# Patient Record
Sex: Male | Born: 1980 | Race: White | Marital: Married
Health system: Northeastern US, Academic
[De-identification: ages and names within clinical notes are randomized; demographics above are authoritative.]

## PROBLEM LIST (undated history)

## (undated) DIAGNOSIS — J45909 Unspecified asthma, uncomplicated: Secondary | ICD-10-CM

## (undated) HISTORY — PX: HERNIA REPAIR: SHX51

## (undated) HISTORY — PX: KNEE ARTHROPLASTY: SHX992

## (undated) HISTORY — PX: VASECTOMY: SHX75

## (undated) NOTE — Progress Notes (Signed)
Formatting of this note is different from the original.  [facility] PHYSICAL [facility]ERAP[facility] [place]  OUTPATIENT PHYSICAL THERAPY  05/18/2020  Note Type: Evaluation      Patient Name: Eric Wilson  Date of Birth:1981/03/14  Diagnosis:   Encounter Diagnosis   Name Primary?   ? Low back pain, unspecified back pain laterality, unspecified chronicity, unspecified whether sciatica present Yes     Referring MD:  Carlena Hurl, PAC     Date of Onset of Impairment-No date available  Date PT Care Plan Established or Reviewed-No date available  Date PT Treatment Started-No date available   Plan of Care Effective Date: 05/18/2020 - 07/16/2020  Visit 1/12 Reassessment due 06/15/20    Assessment/Plan:    Assessment details:       Impairments indicating medical necessity and functional limitations include: significant lower extremity weakness, limited ROM, pain, balance and gait impairments that are limiting her ability to complete basic ADL's or home/community ambulation without difficulty. Treatment classification: Lumbar Derangement. Patient will benefit from skilled PT intervention to address current body structure impairments and activity limitations and return to functions of daily walking, standing tolerance painfree.   Impairments: decreased range of motion, decreased strength, pain and core weakness    Body System: Musculoskeltal  Clinical Presentation: stable  Clinical Decision Making: moderate  Prognosis: good prognosis  Positive Prognosis Rationale: age and motivated for treatment.    Therapy Goals  Goals:      Short-term Goals to be achieved in 3 weeks. Patient will:  1. Be independent with home program for self-management of symptoms.  2. Improve L-spine/L hip pain by 50% for improved mobility and completion of ADL's.  3. Improve L-spine/L hip ROM 50% for improved mobility and completion of ADL's.  4. Improve Modified Oswestry score by 10pts.  5. Improve standing/walking tolerance > 45 min with no greater than 1/10  pain.    Long-term Goals to be achieved in 6 weeks. Patient will:  1. Have full pain free L-spine/ L Hip AROM in all planes.  2. Abolish all symptoms at onset independently 3/3 trials  3. Improve lumbar spine and core strength to 5/5 for improved general mobility   4. Complete community and recreational ambulation without onset of symptoms greater than 1/10 pain.  5. Improve Modified Oswestry score by 20pts.    Plan  Therapy options: will be seen for skilled physical therapy services  Planned therapy interventions: Manual Therapy, Therapeutic Activities, Therapeutic Exercises, Home Exercise Program, Education - Patient and Postural Training    Frequency: 2x week  Duration in weeks: 6  Education provided to: patient.  Education provided: HEP and Posture  Education results: needs reinforcement and verbalized good understanding.  Communication/Consultation: Discussed with PTA.    Total Session Time: 60  Treatment rendered today:       Therapeutic Exercises:  -Prone on elbows for mid range extension x 4 min.  -Prone press ups with sag, 10x  -Standing wall sags for lumbar extension x 10  -Standing lumbar extension with table as fulcrum, use of sheet as fulcrum, 10 x each  -Hooklying abdominal brace 15 x 5 sec holds  -Hooklying abd brace with alt le marching x 10  -Hooklying abd brace with alt hip bent knee fallouts x 10  -Hooklying B hip abd with black theraband with bridging and hold, 15 x 5 sec holds  Plan details:      Intervention to progress with pain reduction, stabilization, core strengthening, postural re-education, recovery of function  and prophylaxis.   Modalities (heat, ice, ultrasound, electrical stimulation, iontophoresis), as needed for pain management. Massage, mobilization and manual therapy as needed to increase ROM in restricted soft tissues and joints. Therapeutic exercise, Neuromuscular Re-education, and education.    Subjective:   History of Present Illness    Date of Evaluation:  05/18/2020    Reason for Referral/Chief Complaint:       Patient is a 23 y/o male who presents to PT with history of intermittant low back pain with intermittant B hip pain. Patient reports original injury occurred in 2008 with most severe re-injury in 2018. Patient reports frequent "flare ups" with increased central lower lumbar pain with stiffness that always take several weeks to return to normal.  Subjective:     Patient reports he recently attended chiropractic session and after manipulation pain was abolished for a brief time.  Quality of life: good    Pain  Current pain rating: 6  At best pain rating: 2  At worst pain rating: 10  Location: Beltline lumbar spine central, left posterior hip intermittant  Quality: aching, radiating and stiffness  Relieving factors: change in position  Aggravating factors: bending and reaching    Physical Limitation(s)-Patient limited with daily functions 25% per Oswestry Index  Current functional status: limited recreation and limited lifting    Precautions and Equipment  Precautions: None  Current Braces/Orthoses: None  Equipment Currently Used: None  Social Support  Hand dominance: right  Communication Preference: verbal, written and visual    Diagnostic Tests  X-ray: normal  MRI studies: normal    Treatments  Previous treatment: chiropractic treatment  Current treatment: chiropractic    Patient Goals  Patient goals for therapy: decreased pain    Objective:     Tenderness   Additional Tenderness Details-L4-S1 spinous processes moderately tender    Active Range of Motion  Lumbar  Flexion: 45 degrees   Extension: 20 degrees     Muscle Activation   Patient unable to activate left transverse abdominals, left multifidus, right transverse abdominals and right multifidus.     Tests   Lumbar   Left   Positive quadrant flexion.   Negative passive SLR and quadrant extension.   Right   Positive passive SLR and quadrant flexion.   Negative quadrant extension.                   I attest  that I have reviewed the above information.  Signed: Linna Darner, PT  05/18/2020 11:44 AM    Electronically signed by Linna Darner, PT at 05/18/2020 11:45 AM EST

## (undated) NOTE — Telephone Encounter (Signed)
Formatting of this note might be different from the original.  Called patient yesterday to remind him of appt-he said he would not be coming back as a patient.  Electronically signed by Jolayne Haines L at 02/29/2020  8:24 AM EST

---

## 2010-11-21 ENCOUNTER — Other Ambulatory Visit: Payer: Self-pay | Admitting: Primary Care

## 2015-06-22 NOTE — H&P (Deleted)
  NTS SOAP Note  Vital Signs:  Vitals as of: 06/21/2015: Systolic 137: Diastolic 82: Heart Rate 73: Temp 98.7F (Temporal): Height 6ft 3in: Weight 252Lbs 0 Ounces: Pain Level 1: BMI 31.5   BMI : 31.5 kg/m2  Subjective: This 35 year old male presents for of a left inguinal hernia.  Has been present  for some time now, but is increasing in size and causing him discomfort.  Made worse with straining.  Review of Symptoms:  Constitutional:unremarkable   Head:unremarkable Eyes:unremarkable   Nose/Mouth/Throat:unremarkable Cardiovascular:  unremarkable Respiratory:unremarkable Gastrointestinal:  unremarkable   Genitourinary:unremarkable   joint and back pain Skin:unremarkable Hematolgic/Lymphatic:unremarkable   Allergic/Immunologic:unremarkable   Past Medical History:  Reviewed  Past Medical History  Surgical History: cholecystectomy Medical Problems: none Allergies: nkda Medications: none   Social History:Reviewed  Social History  Preferred Language: English Race:  White Ethnicity: Not Hispanic / Latino Age: 68 Years 2 Months Marital Status:  S Alcohol: socially Recreational drug(s):  No   Smoking Status: Never smoker reviewed on 06/21/2015 Functional Status reviewed on 06/21/2015 ------------------------------------------------ Bathing: Normal Cooking: Normal Dressing: Normal Driving: Normal Eating: Normal Managing Meds: Normal Oral Care: Normal Shopping: Normal Toileting: Normal Transferring: Normal Walking: Normal Cognitive Status reviewed on 06/21/2015 ------------------------------------------------ Attention: Normal Decision Making: Normal Language: Normal Memory: Normal Motor: Normal Perception: Normal Problem Solving: Normal Visual and Spatial: Normal   Family History:Reviewed  Family Health History Mother, Deceased; Healthy;  Father, Deceased; Cancer unspecified - Throat;     Objective Information: General:Well  appearing, well nourished in no distress. Heart:RRR, no murmur or gallop.  Normal S1, S2.  No S3, S4.  Lungs:  CTA bilaterally, no wheezes, rhonchi, rales.  Breathing unlabored. Abdomen:Soft, NT/ND, no HSM, no masses.  Reducible left inguinal hernia, large.  Assessment:Left inguinal hernia  Diagnoses: 550.90  K40.90 Inguinal hernia (Unilateral inguinal hernia, without obstruction or gangrene, not specified as recurrent)  Procedures: 99214 - OFFICE OUTPATIENT VISIT 25 MINUTES    Plan:   Scheduled for left inguinal herniorrhaphy with mesh on 07/18/15.   Patient Education:Alternative treatments to surgery were discussed with patient (and family).  Risks and benefits  of procedure including bleeding, infection, mesh use, and the possibility of recurrence of the hernia were fully explained to the patient (and family) who gave informed consent. Patient/family questions were addressed.  Follow-up:Pending Surgery 

## 2015-07-16 ENCOUNTER — Encounter (HOSPITAL_COMMUNITY): Payer: Self-pay

## 2015-07-16 ENCOUNTER — Encounter (HOSPITAL_COMMUNITY)
Admission: RE | Admit: 2015-07-16 | Discharge: 2015-07-16 | Disposition: A | Discharge status: Home / Self Care | Payer: BLUE CROSS/BLUE SHIELD | Source: Ambulatory Visit | Attending: General Surgery | Admitting: General Surgery

## 2015-07-16 DIAGNOSIS — Z01812 Encounter for preprocedural laboratory examination: Secondary | ICD-10-CM | POA: Diagnosis present

## 2015-07-16 HISTORY — DX: Unspecified asthma, uncomplicated: J45.909

## 2015-07-16 LAB — CBC WITH DIFFERENTIAL/PLATELET
BASOS PCT: 0 %
Basophils Absolute: 0 10*3/uL (ref 0.0–0.1)
EOS ABS: 0.2 10*3/uL (ref 0.0–0.7)
EOS PCT: 3 %
HEMATOCRIT: 44 % (ref 39.0–52.0)
Hemoglobin: 15.2 g/dL (ref 13.0–17.0)
Lymphocytes Relative: 25 %
Lymphs Abs: 1.3 10*3/uL (ref 0.7–4.0)
MCH: 29.1 pg (ref 26.0–34.0)
MCHC: 34.5 g/dL (ref 30.0–36.0)
MCV: 84.1 fL (ref 78.0–100.0)
MONO ABS: 0.6 10*3/uL (ref 0.1–1.0)
MONOS PCT: 11 %
Neutro Abs: 3 10*3/uL (ref 1.7–7.7)
Neutrophils Relative %: 61 %
PLATELETS: 184 10*3/uL (ref 150–400)
RBC: 5.23 MIL/uL (ref 4.22–5.81)
RDW: 12.7 % (ref 11.5–15.5)
WBC: 5 10*3/uL (ref 4.0–10.5)

## 2015-07-16 LAB — BASIC METABOLIC PANEL
Anion gap: 8 (ref 5–15)
BUN: 12 mg/dL (ref 6–20)
CALCIUM: 9.3 mg/dL (ref 8.9–10.3)
CO2: 29 mmol/L (ref 22–32)
CREATININE: 0.86 mg/dL (ref 0.61–1.24)
Chloride: 105 mmol/L (ref 101–111)
GFR calc Af Amer: >60 mL/min (ref >60)
GLUCOSE: 96 mg/dL (ref 65–99)
Potassium: 4.8 mmol/L (ref 3.5–5.1)
Sodium: 142 mmol/L (ref 135–145)

## 2015-07-16 NOTE — H&P (Signed)
  NTS SOAP Note  Vital Signs:  Vitals as of: 07/16/2015: Systolic 114: Diastolic 82: Heart Rate 75: Temp 98.84F (Temporal): Height 655ft 6in: Weight 158Lbs 0 Ounces: BMI 25.5   BMI : 25.5 kg/m2  Subjective: This 35 year old male presents for of a growth on his left hip.  Has been present for some time, but is increasing is size and causing discomfort.    Review of Symptoms:  Constitutional:unremarkable   Head:unremarkable Eyes:unremarkable   sinus problems Cardiovascular:  unremarkable Respiratory:unremarkable Gastrointestinal:  unremarkable   Genitourinary:unremarkable   joint, neck, and back pain Hematolgic/Lymphatic:unremarkable   Allergic/Immunologic:unremarkable   Past Medical History:  Reviewed  Past Medical History  Surgical History: right knee surgery, hernia repair Medical Problems: none Allergies: darvocet, septra Medications: none   Social History:Reviewed  Social History  Preferred Language: English Race:  White Ethnicity: Not Hispanic / Latino Age: [age] year Marital Status:  S Alcohol: socially   Smoking Status: Never smoker reviewed on 06/07/2015 Functional Status reviewed on 06/07/2015 ------------------------------------------------ Bathing: Normal Cooking: Normal Dressing: Normal Driving: Normal Eating: Normal Managing Meds: Normal Oral Care: Normal Shopping: Normal Toileting: Normal Transferring: Normal Walking: Normal Cognitive Status reviewed on 06/07/2015 ------------------------------------------------ Attention: Normal Decision Making: Normal Language: Normal Memory: Normal Motor: Normal Perception: Normal Problem Solving: Normal Visual and Spatial: Normal   Family History:Reviewed  Family Health History Family History is Unknown    Objective Information: General:Well appearing, well nourished in no distress. 3cm exophytic soft tissue mass, left hip, tender to touch Heart:RRR, no murmur Lungs:  CTA  bilaterally, no wheezes, rhonchi, rales.  Breathing unlabored.  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   Patient Education:Alternative treatments to surgery were discussed with patient (and family).  Risks and benefits  of procedure were fully explained to the patient (and family) who gave informed consent. Patient/family questions were addressed.

## 2015-07-16 NOTE — Patient Instructions (Signed)
    Sean Rojas  07/16/2015     @PREFPERIOPPHARMACY @   Your procedure is scheduled on 07/20/2015.  [facility].  Call this number if you have problems the morning of surgery:  [phone]   Remember:  Do not eat food or drink liquids after midnight.  Take these medicines the morning of surgery with A SIP OF WATER NA   Do not wear jewelry, make-up or nail polish.  Do not wear lotions, powders, or perfumes.  You may wear deodorant.  Do not shave 48 hours prior to surgery.  Men may shave face and neck.  Do not bring valuables to the hospital.  [facility] belongings or valuables.  Contacts, dentures or bridgework may not be worn into surgery.  Leave your suitcase in the car.  After surgery it may be brought to your room.  For patients admitted to the hospital, discharge time will be determined by your treatment team.  Patients discharged the day of surgery will not be allowed to drive home.    Please read over the following fact sheets that you were given. Surgical Site Infection Prevention and Anesthesia Post-op Instructions     PATIENT INSTRUCTIONS POST-ANESTHESIA  IMMEDIATELY FOLLOWING SURGERY:  Do not drive or operate machinery for the first twenty four hours after surgery.  Do not make any important decisions for twenty four hours after surgery or while taking narcotic pain medications or sedatives.  If you develop intractable nausea and vomiting or a severe headache please notify your doctor immediately.  FOLLOW-UP:  Please make an appointment with your surgeon as instructed. You do not need to follow up with anesthesia unless specifically instructed to do so.  WOUND CARE INSTRUCTIONS (if applicable):  Keep a dry clean dressing on the anesthesia/puncture wound site if there is drainage.  Once the wound has quit draining you may leave it open to air.  Generally you should leave the bandage intact for twenty four hours unless  there is drainage.  If the epidural site drains for more than 36-48 hours please call the anesthesia department.  QUESTIONS?:  Please feel free to call your physician or the hospital operator if you have any questions, and they will be happy to assist you.

## 2015-07-20 ENCOUNTER — Encounter (HOSPITAL_COMMUNITY): Payer: Self-pay | Admitting: Anesthesiology

## 2015-07-20 ENCOUNTER — Ambulatory Visit (HOSPITAL_COMMUNITY)
Admission: RE | Admit: 2015-07-20 | Discharge: 2015-07-20 | Disposition: A | Discharge status: Home / Self Care | Payer: BLUE CROSS/BLUE SHIELD | Source: Ambulatory Visit | Attending: General Surgery | Admitting: General Surgery

## 2015-07-20 ENCOUNTER — Encounter (HOSPITAL_COMMUNITY)
Admission: RE | Disposition: A | Discharge status: Home / Self Care | Payer: Self-pay | Source: Ambulatory Visit | Attending: General Surgery

## 2015-07-20 ENCOUNTER — Ambulatory Visit (HOSPITAL_COMMUNITY): Payer: BLUE CROSS/BLUE SHIELD | Admitting: Anesthesiology

## 2015-07-20 DIAGNOSIS — R2232 Localized swelling, mass and lump, left upper limb: Secondary | ICD-10-CM | POA: Diagnosis not present

## 2015-07-20 DIAGNOSIS — Z87891 Personal history of nicotine dependence: Secondary | ICD-10-CM | POA: Insufficient documentation

## 2015-07-20 HISTORY — PX: MASS EXCISION: SHX2000

## 2015-07-20 SURGERY — EXCISION MASS
Anesthesia: General | Site: Hip | Laterality: Left

## 2015-07-20 MED ORDER — LIDOCAINE HCL 1 % IJ SOLN
INTRAMUSCULAR | Status: DC | PRN
  Administered 2015-07-20: 40 mg via INTRADERMAL

## 2015-07-20 MED ORDER — BUPIVACAINE HCL (PF) 0.5 % IJ SOLN
INTRAMUSCULAR | Status: AC
  Filled 2015-07-20: qty 30

## 2015-07-20 MED ORDER — BUPIVACAINE HCL (PF) 0.5 % IJ SOLN
INTRAMUSCULAR | Status: DC | PRN
  Administered 2015-07-20: 4.5 mL

## 2015-07-20 MED ORDER — ONDANSETRON HCL 4 MG/2ML IJ SOLN
4.0000 mg | Freq: Once | INTRAMUSCULAR | Status: AC
  Administered 2015-07-20: 4 mg via INTRAVENOUS
  Filled 2015-07-20: qty 2

## 2015-07-20 MED ORDER — FENTANYL CITRATE (PF) 100 MCG/2ML IJ SOLN
INTRAMUSCULAR | Status: DC | PRN
  Administered 2015-07-20: 50 ug via INTRAVENOUS

## 2015-07-20 MED ORDER — DEXAMETHASONE SODIUM PHOSPHATE 4 MG/ML IJ SOLN
4.0000 mg | Freq: Once | INTRAMUSCULAR | Status: AC
  Administered 2015-07-20: 4 mg via INTRAVENOUS
  Filled 2015-07-20: qty 1

## 2015-07-20 MED ORDER — MIDAZOLAM HCL 2 MG/2ML IJ SOLN
1.0000 mg | INTRAMUSCULAR | Status: DC | PRN
  Administered 2015-07-20: 2 mg via INTRAVENOUS
  Filled 2015-07-20: qty 2

## 2015-07-20 MED ORDER — PROPOFOL 10 MG/ML IV BOLUS
INTRAVENOUS | Status: DC | PRN
  Administered 2015-07-20: 150 mg via INTRAVENOUS

## 2015-07-20 MED ORDER — FENTANYL CITRATE (PF) 100 MCG/2ML IJ SOLN
INTRAMUSCULAR | Status: AC
  Filled 2015-07-20: qty 2

## 2015-07-20 MED ORDER — LIDOCAINE HCL (PF) 1 % IJ SOLN
INTRAMUSCULAR | Status: AC
  Filled 2015-07-20: qty 5

## 2015-07-20 MED ORDER — PROPOFOL 10 MG/ML IV BOLUS
INTRAVENOUS | Status: AC
  Filled 2015-07-20: qty 20

## 2015-07-20 MED ORDER — BACITRACIN-NEOMYCIN-POLYMYXIN 400-5-5000 EX OINT
TOPICAL_OINTMENT | CUTANEOUS | Status: AC
  Filled 2015-07-20: qty 1

## 2015-07-20 MED ORDER — KETOROLAC TROMETHAMINE 30 MG/ML IJ SOLN
INTRAMUSCULAR | Status: AC
  Filled 2015-07-20: qty 1

## 2015-07-20 MED ORDER — FENTANYL CITRATE (PF) 100 MCG/2ML IJ SOLN
25.0000 ug | INTRAMUSCULAR | Status: AC
Start: 2015-07-20 — End: 2015-07-20
  Administered 2015-07-20: 25 ug via INTRAVENOUS
  Filled 2015-07-20: qty 2

## 2015-07-20 MED ORDER — CHLORHEXIDINE GLUCONATE 4 % EX LIQD: 1.0000 "application " | Freq: Once | CUTANEOUS | Status: DC

## 2015-07-20 MED ORDER — POVIDONE-IODINE 10 % EX OINT
TOPICAL_OINTMENT | CUTANEOUS | Status: AC
  Filled 2015-07-20: qty 1

## 2015-07-20 MED ORDER — LACTATED RINGERS IV SOLN
INTRAVENOUS | Status: DC
  Administered 2015-07-20: 08:00:00 via INTRAVENOUS

## 2015-07-20 MED ORDER — CEFAZOLIN SODIUM-DEXTROSE 2-4 GM/100ML-% IV SOLN
2.0000 g | INTRAVENOUS | Status: AC
  Administered 2015-07-20: 2 g via INTRAVENOUS
  Filled 2015-07-20: qty 100

## 2015-07-20 MED ORDER — POVIDONE-IODINE 10 % OINT PACKET
TOPICAL_OINTMENT | CUTANEOUS | Status: DC | PRN
  Administered 2015-07-20: 1 via TOPICAL

## 2015-07-20 MED ORDER — FENTANYL CITRATE (PF) 100 MCG/2ML IJ SOLN: 25.0000 ug | INTRAMUSCULAR | Status: DC | PRN

## 2015-07-20 MED ORDER — ONDANSETRON HCL 4 MG/2ML IJ SOLN: 4.0000 mg | Freq: Once | INTRAMUSCULAR | Status: DC | PRN

## 2015-07-20 MED ORDER — 0.9 % SODIUM CHLORIDE (POUR BTL) OPTIME
TOPICAL | Status: DC | PRN
  Administered 2015-07-20: 1000 mL

## 2015-07-20 MED ORDER — HYDROCODONE-ACETAMINOPHEN 5-325 MG PO TABS: 1.0000 | ORAL_TABLET | ORAL | Status: AC | PRN

## 2015-07-20 MED ORDER — KETOROLAC TROMETHAMINE 30 MG/ML IJ SOLN
30.0000 mg | Freq: Once | INTRAMUSCULAR | Status: AC
  Administered 2015-07-20: 30 mg via INTRAVENOUS

## 2015-07-20 SURGICAL SUPPLY — 40 items
BAG HAMPER (MISCELLANEOUS) ×3 IMPLANT
BLADE SURG SZ11 CARB STEEL (BLADE) IMPLANT
CHLORAPREP W/TINT 10.5 ML (MISCELLANEOUS) ×3 IMPLANT
CLOTH BEACON ORANGE TIMEOUT ST (SAFETY) ×3 IMPLANT
COVER LIGHT HANDLE STERIS (MISCELLANEOUS) ×6 IMPLANT
DECANTER SPIKE VIAL GLASS SM (MISCELLANEOUS) ×3 IMPLANT
DERMABOND ADVANCED (GAUZE/BANDAGES/DRESSINGS)
DERMABOND ADVANCED .7 DNX12 (GAUZE/BANDAGES/DRESSINGS) IMPLANT
DRAPE EENT ADH APERT 31X51 STR (DRAPES) IMPLANT
ELECT NEEDLE TIP 2.8 STRL (NEEDLE) ×3 IMPLANT
ELECT REM PT RETURN 9FT ADLT (ELECTROSURGICAL) ×3
ELECTRODE REM PT RTRN 9FT ADLT (ELECTROSURGICAL) ×1 IMPLANT
FORMALIN 10 PREFIL 120ML (MISCELLANEOUS) ×3 IMPLANT
GLOVE BIO SURGEON STRL SZ7 (GLOVE) ×3 IMPLANT
GLOVE BIOGEL PI IND STRL 7.0 (GLOVE) ×2 IMPLANT
GLOVE BIOGEL PI INDICATOR 7.0 (GLOVE) ×4
GLOVE ECLIPSE 6.5 STRL STRAW (GLOVE) ×3 IMPLANT
GLOVE EXAM NITRILE MD LF STRL (GLOVE) ×3 IMPLANT
GLOVE SURG SS PI 7.5 STRL IVOR (GLOVE) ×3 IMPLANT
GOWN STRL REUS W/ TWL XL LVL3 (GOWN DISPOSABLE) ×1 IMPLANT
GOWN STRL REUS W/TWL LRG LVL3 (GOWN DISPOSABLE) ×6 IMPLANT
GOWN STRL REUS W/TWL XL LVL3 (GOWN DISPOSABLE) ×2
KIT ROOM TURNOVER APOR (KITS) ×3 IMPLANT
MANIFOLD NEPTUNE II (INSTRUMENTS) ×3 IMPLANT
NEEDLE 22X1 1/2 OR ONLY (MISCELLANEOUS)
NEEDLE 22X1.5 STRL (OR ONLY) (MISCELLANEOUS) IMPLANT
NEEDLE HYPO 25X1 1.5 SAFETY (NEEDLE) ×3 IMPLANT
NS IRRIG 1000ML POUR BTL (IV SOLUTION) ×3 IMPLANT
PACK MINOR (CUSTOM PROCEDURE TRAY) ×3 IMPLANT
PAD ARMBOARD 7.5X6 YLW CONV (MISCELLANEOUS) ×3 IMPLANT
SET BASIN LINEN APH (SET/KITS/TRAYS/PACK) ×3 IMPLANT
SPONGE GAUZE 2X2 8PLY STER LF (GAUZE/BANDAGES/DRESSINGS) ×1
SPONGE GAUZE 2X2 8PLY STRL LF (GAUZE/BANDAGES/DRESSINGS) ×2 IMPLANT
SUT ETHILON 3 0 FSL (SUTURE) IMPLANT
SUT PROLENE 4 0 PS 2 18 (SUTURE) ×9 IMPLANT
SUT VIC AB 3-0 SH 27 (SUTURE) ×2
SUT VIC AB 3-0 SH 27X BRD (SUTURE) ×1 IMPLANT
SUT VIC AB 4-0 PS2 27 (SUTURE) IMPLANT
SYR CONTROL 10ML LL (SYRINGE) ×3 IMPLANT
TAPE CLOTH SURG 4X10 WHT LF (GAUZE/BANDAGES/DRESSINGS) ×3 IMPLANT

## 2015-07-20 NOTE — Discharge Instructions (Signed)

## 2015-07-20 NOTE — Anesthesia Postprocedure Evaluation (Signed)
Anesthesia Post Note  Patient: Sean Rojas  Procedure(s) Performed: Procedure(s) (LRB): EXCISION SOFT TISSUE MASS 3 CM (Left)  Patient location during evaluation: PACU Anesthesia Type: General Level of consciousness: awake and alert Pain management: pain level controlled Vital Signs Assessment: post-procedure vital signs reviewed and stable Respiratory status: spontaneous breathing Cardiovascular status: blood pressure returned to baseline Postop Assessment: no signs of nausea or vomiting Anesthetic complications: no    Last Vitals:  Filed Vitals:   07/20/15 0930 07/20/15 0941  BP: 114/82 134/90  Pulse: 25 67  Temp:  36.3 C  Resp: 12 18    Last Pain:  Filed Vitals:   07/20/15 0943  PainSc: 3                  Nikole Swartzentruber

## 2015-07-20 NOTE — Anesthesia Preprocedure Evaluation (Signed)
Anesthesia Evaluation  Patient identified by MRN, date of birth, ID band Patient awake    Reviewed: Allergy & Precautions, NPO status , Patient's Chart, lab work & pertinent test results  Airway Mallampati: II  TM Distance: >3 FB     Dental  (+) Teeth Intact   Pulmonary asthma (mild) , former smoker,    breath sounds clear to auscultation       Cardiovascular negative cardio ROS   Rhythm:Regular Rate:Normal     Neuro/Psych    GI/Hepatic negative GI ROS,   Endo/Other    Renal/GU      Musculoskeletal   Abdominal   Peds  Hematology   Anesthesia Other Findings   Reproductive/Obstetrics                             Anesthesia Physical Anesthesia Plan  ASA: II  Anesthesia Plan: General   Post-op Pain Management:    Induction: Intravenous  Airway Management Planned: LMA  Additional Equipment:   Intra-op Plan:   Post-operative Plan: Extubation in OR  Informed Consent: I have reviewed the patients History and Physical, chart, labs and discussed the procedure including the risks, benefits and alternatives for the proposed anesthesia with the patient or authorized representative who has indicated his/her understanding and acceptance.     Plan Discussed with:   Anesthesia Plan Comments:         Anesthesia Quick Evaluation

## 2015-07-20 NOTE — Interval H&P Note (Signed)
History and Physical Interval Note:  07/20/2015 8:01 AM  Sean Rojas  has presented today for surgery, with the diagnosis of soft tissue mass left hip  The various methods of treatment have been discussed with the patient and family. After consideration of risks, benefits and other options for treatment, the patient has consented to  Procedure(s): EXCISION SOFT TISSUE MASS 3 CM (Left) as a surgical intervention .  The patient's history has been reviewed, patient examined, no change in status, stable for surgery.  I have reviewed the patient's chart and labs.  Questions were answered to the patient's satisfaction.     Franky MachoJENKINS,Ranvir Renovato A

## 2015-07-20 NOTE — Op Note (Signed)
Patient:  Gus Heightaron Gheen  DOB:  15-Jan-1981  MRN:  [id number]   Preop Diagnosis:  3 cm soft tissue mass, left thigh  Postop Diagnosis:  Same  Procedure:  Excision of 3 cm soft tissue mass, left thigh  Surgeon:  Franky MachoMark Jesalyn Finazzo, M.D.  Anes:  Gen.  Indications:  Patient is a 35 year old white male who presents with a soft tissue mass on the left thigh. The risks and benefits of the procedure were fully explained to the patient, who gave informed consent.  Procedure note:  The patient was placed the supine position. After general anesthesia was administered, the left thigh was prepped and draped using usual the sterile technique with DuraPrep. Surgical site confirmation was performed.  An elliptical incision was made around the base of the growth on the left thigh. The dissection was taken down to the subcutaneous tissue. Lipomatous tissue was found. This was sent to pathology for further examination. A bleeding was controlled using Bovie electrocautery. The subcutaneous layer was reapproximated using a 3-0 Vicryl interrupted suture. The skin was closed using 4-0 Prolene interrupted sutures. 0.5% Sensorcaine was instilled into the surrounding wound. Betadine ointment and a dry sterile dressing were applied.  All tape and needle counts were correct at the end of the procedure. Patient was awakened and transferred to PACU in stable condition.    Complications:  None  EBL:  Minimal  Specimen:  Soft tissue mass, left thigh

## 2015-07-20 NOTE — Anesthesia Procedure Notes (Addendum)
Procedure Name: LMA Insertion Date/Time: 07/20/2015 8:17 AM Performed by: Glynn OctaveANIEL, Jasean Ambrosia E Pre-anesthesia Checklist: Patient identified, Patient being monitored, Emergency Drugs available, Timeout performed and Suction available Patient Re-evaluated:Patient Re-evaluated prior to inductionOxygen Delivery Method: Circle System Utilized Preoxygenation: Pre-oxygenation with 100% oxygen Intubation Type: IV induction Ventilation: Mask ventilation without difficulty LMA: LMA inserted LMA Size: 4.0 Number of attempts: 1 Placement Confirmation: positive ETCO2 and breath sounds checked- equal and bilateral

## 2015-07-20 NOTE — Transfer of Care (Signed)
Immediate Anesthesia Transfer of Care Note  Patient: Sean Rojas [name]  Procedure(s) Performed: Procedure(s): EXCISION SOFT TISSUE MASS 3 CM (Left)  Patient Location: PACU  Anesthesia Type:General  Level of Consciousness: awake, alert  and oriented  Airway & Oxygen Therapy: Patient Spontanous Breathing and Patient connected to face mask oxygen  Post-op Assessment: Report given to RN  Post vital signs: Reviewed and stable  Last Vitals:  Filed Vitals:   07/20/15 0800 07/20/15 0805  BP: 110/64 111/75  Pulse:    Temp:    Resp: 12 17    Complications: No apparent anesthesia complications

## 2015-07-23 ENCOUNTER — Encounter (HOSPITAL_COMMUNITY): Payer: Self-pay | Admitting: General Surgery

## 2015-12-05 DIAGNOSIS — S134XXA Sprain of ligaments of cervical spine, initial encounter: Secondary | ICD-10-CM | POA: Diagnosis not present

## 2015-12-05 DIAGNOSIS — S338XXA Sprain of other parts of lumbar spine and pelvis, initial encounter: Secondary | ICD-10-CM | POA: Diagnosis not present

## 2015-12-05 DIAGNOSIS — M531 Cervicobrachial syndrome: Secondary | ICD-10-CM | POA: Diagnosis not present

## 2015-12-05 DIAGNOSIS — M546 Pain in thoracic spine: Secondary | ICD-10-CM | POA: Diagnosis not present

## 2015-12-14 DIAGNOSIS — S134XXA Sprain of ligaments of cervical spine, initial encounter: Secondary | ICD-10-CM | POA: Diagnosis not present

## 2015-12-14 DIAGNOSIS — M531 Cervicobrachial syndrome: Secondary | ICD-10-CM | POA: Diagnosis not present

## 2015-12-14 DIAGNOSIS — M546 Pain in thoracic spine: Secondary | ICD-10-CM | POA: Diagnosis not present

## 2015-12-14 DIAGNOSIS — S338XXA Sprain of other parts of lumbar spine and pelvis, initial encounter: Secondary | ICD-10-CM | POA: Diagnosis not present

## 2015-12-19 DIAGNOSIS — S134XXA Sprain of ligaments of cervical spine, initial encounter: Secondary | ICD-10-CM | POA: Diagnosis not present

## 2015-12-19 DIAGNOSIS — S338XXA Sprain of other parts of lumbar spine and pelvis, initial encounter: Secondary | ICD-10-CM | POA: Diagnosis not present

## 2015-12-19 DIAGNOSIS — M531 Cervicobrachial syndrome: Secondary | ICD-10-CM | POA: Diagnosis not present

## 2015-12-19 DIAGNOSIS — M546 Pain in thoracic spine: Secondary | ICD-10-CM | POA: Diagnosis not present

## 2016-01-01 DIAGNOSIS — S134XXA Sprain of ligaments of cervical spine, initial encounter: Secondary | ICD-10-CM | POA: Diagnosis not present

## 2016-01-01 DIAGNOSIS — M531 Cervicobrachial syndrome: Secondary | ICD-10-CM | POA: Diagnosis not present

## 2016-01-01 DIAGNOSIS — M546 Pain in thoracic spine: Secondary | ICD-10-CM | POA: Diagnosis not present

## 2016-01-01 DIAGNOSIS — S338XXA Sprain of other parts of lumbar spine and pelvis, initial encounter: Secondary | ICD-10-CM | POA: Diagnosis not present

## 2016-02-08 DIAGNOSIS — S338XXA Sprain of other parts of lumbar spine and pelvis, initial encounter: Secondary | ICD-10-CM | POA: Diagnosis not present

## 2016-02-08 DIAGNOSIS — S134XXA Sprain of ligaments of cervical spine, initial encounter: Secondary | ICD-10-CM | POA: Diagnosis not present

## 2016-02-08 DIAGNOSIS — M531 Cervicobrachial syndrome: Secondary | ICD-10-CM | POA: Diagnosis not present

## 2016-02-08 DIAGNOSIS — M546 Pain in thoracic spine: Secondary | ICD-10-CM | POA: Diagnosis not present

## 2016-07-21 DIAGNOSIS — M531 Cervicobrachial syndrome: Secondary | ICD-10-CM | POA: Diagnosis not present

## 2016-07-21 DIAGNOSIS — M546 Pain in thoracic spine: Secondary | ICD-10-CM | POA: Diagnosis not present

## 2016-07-21 DIAGNOSIS — S338XXA Sprain of other parts of lumbar spine and pelvis, initial encounter: Secondary | ICD-10-CM | POA: Diagnosis not present

## 2016-07-21 DIAGNOSIS — S134XXA Sprain of ligaments of cervical spine, initial encounter: Secondary | ICD-10-CM | POA: Diagnosis not present

## 2016-07-29 DIAGNOSIS — S134XXA Sprain of ligaments of cervical spine, initial encounter: Secondary | ICD-10-CM | POA: Diagnosis not present

## 2016-07-29 DIAGNOSIS — M546 Pain in thoracic spine: Secondary | ICD-10-CM | POA: Diagnosis not present

## 2016-07-29 DIAGNOSIS — S338XXA Sprain of other parts of lumbar spine and pelvis, initial encounter: Secondary | ICD-10-CM | POA: Diagnosis not present

## 2016-07-29 DIAGNOSIS — M531 Cervicobrachial syndrome: Secondary | ICD-10-CM | POA: Diagnosis not present

## 2016-08-05 DIAGNOSIS — S134XXA Sprain of ligaments of cervical spine, initial encounter: Secondary | ICD-10-CM | POA: Diagnosis not present

## 2016-08-05 DIAGNOSIS — M546 Pain in thoracic spine: Secondary | ICD-10-CM | POA: Diagnosis not present

## 2016-08-05 DIAGNOSIS — M531 Cervicobrachial syndrome: Secondary | ICD-10-CM | POA: Diagnosis not present

## 2016-08-05 DIAGNOSIS — S338XXA Sprain of other parts of lumbar spine and pelvis, initial encounter: Secondary | ICD-10-CM | POA: Diagnosis not present

## 2016-08-21 DIAGNOSIS — M546 Pain in thoracic spine: Secondary | ICD-10-CM | POA: Diagnosis not present

## 2016-08-21 DIAGNOSIS — S134XXA Sprain of ligaments of cervical spine, initial encounter: Secondary | ICD-10-CM | POA: Diagnosis not present

## 2016-08-21 DIAGNOSIS — M531 Cervicobrachial syndrome: Secondary | ICD-10-CM | POA: Diagnosis not present

## 2016-08-21 DIAGNOSIS — S338XXA Sprain of other parts of lumbar spine and pelvis, initial encounter: Secondary | ICD-10-CM | POA: Diagnosis not present

## 2016-09-22 DIAGNOSIS — M531 Cervicobrachial syndrome: Secondary | ICD-10-CM | POA: Diagnosis not present

## 2016-09-22 DIAGNOSIS — S338XXA Sprain of other parts of lumbar spine and pelvis, initial encounter: Secondary | ICD-10-CM | POA: Diagnosis not present

## 2016-09-22 DIAGNOSIS — S134XXA Sprain of ligaments of cervical spine, initial encounter: Secondary | ICD-10-CM | POA: Diagnosis not present

## 2016-09-22 DIAGNOSIS — M546 Pain in thoracic spine: Secondary | ICD-10-CM | POA: Diagnosis not present

## 2017-08-12 DIAGNOSIS — Z9852 Vasectomy status: Secondary | ICD-10-CM | POA: Diagnosis not present

## 2017-10-19 DIAGNOSIS — J4520 Mild intermittent asthma, uncomplicated: Secondary | ICD-10-CM | POA: Diagnosis not present

## 2017-10-19 DIAGNOSIS — F900 Attention-deficit hyperactivity disorder, predominantly inattentive type: Secondary | ICD-10-CM | POA: Diagnosis not present

## 2017-10-19 DIAGNOSIS — Z6823 Body mass index (BMI) 23.0-23.9, adult: Secondary | ICD-10-CM | POA: Diagnosis not present

## 2017-12-14 ENCOUNTER — Encounter: Payer: Self-pay | Admitting: Psychology

## 2017-12-17 DIAGNOSIS — M546 Pain in thoracic spine: Secondary | ICD-10-CM | POA: Diagnosis not present

## 2017-12-17 DIAGNOSIS — S335XXA Sprain of ligaments of lumbar spine, initial encounter: Secondary | ICD-10-CM | POA: Diagnosis not present

## 2017-12-17 DIAGNOSIS — S134XXA Sprain of ligaments of cervical spine, initial encounter: Secondary | ICD-10-CM | POA: Diagnosis not present

## 2017-12-21 DIAGNOSIS — M546 Pain in thoracic spine: Secondary | ICD-10-CM | POA: Diagnosis not present

## 2017-12-21 DIAGNOSIS — S335XXA Sprain of ligaments of lumbar spine, initial encounter: Secondary | ICD-10-CM | POA: Diagnosis not present

## 2017-12-21 DIAGNOSIS — S134XXA Sprain of ligaments of cervical spine, initial encounter: Secondary | ICD-10-CM | POA: Diagnosis not present

## 2017-12-23 DIAGNOSIS — S134XXA Sprain of ligaments of cervical spine, initial encounter: Secondary | ICD-10-CM | POA: Diagnosis not present

## 2017-12-23 DIAGNOSIS — M546 Pain in thoracic spine: Secondary | ICD-10-CM | POA: Diagnosis not present

## 2017-12-23 DIAGNOSIS — S335XXA Sprain of ligaments of lumbar spine, initial encounter: Secondary | ICD-10-CM | POA: Diagnosis not present

## 2018-01-01 DIAGNOSIS — S335XXA Sprain of ligaments of lumbar spine, initial encounter: Secondary | ICD-10-CM | POA: Diagnosis not present

## 2018-01-01 DIAGNOSIS — M546 Pain in thoracic spine: Secondary | ICD-10-CM | POA: Diagnosis not present

## 2018-01-01 DIAGNOSIS — S134XXA Sprain of ligaments of cervical spine, initial encounter: Secondary | ICD-10-CM | POA: Diagnosis not present

## 2018-01-08 DIAGNOSIS — S134XXA Sprain of ligaments of cervical spine, initial encounter: Secondary | ICD-10-CM | POA: Diagnosis not present

## 2018-01-08 DIAGNOSIS — M546 Pain in thoracic spine: Secondary | ICD-10-CM | POA: Diagnosis not present

## 2018-01-08 DIAGNOSIS — S335XXA Sprain of ligaments of lumbar spine, initial encounter: Secondary | ICD-10-CM | POA: Diagnosis not present

## 2018-01-12 DIAGNOSIS — R4586 Emotional lability: Secondary | ICD-10-CM | POA: Diagnosis not present

## 2018-01-12 DIAGNOSIS — F900 Attention-deficit hyperactivity disorder, predominantly inattentive type: Secondary | ICD-10-CM | POA: Diagnosis not present

## 2018-01-12 DIAGNOSIS — Z6825 Body mass index (BMI) 25.0-25.9, adult: Secondary | ICD-10-CM | POA: Diagnosis not present

## 2018-01-29 DIAGNOSIS — F331 Major depressive disorder, recurrent, moderate: Secondary | ICD-10-CM | POA: Diagnosis not present

## 2018-01-29 DIAGNOSIS — F411 Generalized anxiety disorder: Secondary | ICD-10-CM | POA: Diagnosis not present

## 2018-02-05 DIAGNOSIS — F411 Generalized anxiety disorder: Secondary | ICD-10-CM | POA: Diagnosis not present

## 2018-02-05 DIAGNOSIS — F331 Major depressive disorder, recurrent, moderate: Secondary | ICD-10-CM | POA: Diagnosis not present

## 2018-02-12 DIAGNOSIS — F331 Major depressive disorder, recurrent, moderate: Secondary | ICD-10-CM | POA: Diagnosis not present

## 2018-02-12 DIAGNOSIS — F411 Generalized anxiety disorder: Secondary | ICD-10-CM | POA: Diagnosis not present

## 2018-02-19 ENCOUNTER — Encounter

## 2018-02-19 ENCOUNTER — Encounter: Payer: BLUE CROSS/BLUE SHIELD | Attending: Psychology | Admitting: Psychology

## 2018-02-19 DIAGNOSIS — F0630 Mood disorder due to known physiological condition, unspecified: Secondary | ICD-10-CM | POA: Diagnosis not present

## 2018-02-19 DIAGNOSIS — F39 Unspecified mood [affective] disorder: Secondary | ICD-10-CM | POA: Insufficient documentation

## 2018-02-19 DIAGNOSIS — F908 Attention-deficit hyperactivity disorder, other type: Secondary | ICD-10-CM

## 2018-02-19 DIAGNOSIS — Z23 Encounter for immunization: Secondary | ICD-10-CM | POA: Diagnosis not present

## 2018-02-28 ENCOUNTER — Encounter: Payer: Self-pay | Admitting: Psychology

## 2018-02-28 NOTE — Progress Notes (Signed)
Neuropsychological Consultation   Patient:   Sean Rojas   DOB:   Sep 17, 1980  MR Number:  [id number]  Location:  [facility] FOR PA[facility] MEDICINE [facility] PHYSICAL MEDICINE AND REHABILITATION [address] [place], [address] [id number] [facility] [place] [address] Dept: [phone]           Date of Service:   02/19/2018  Start Time:   8 AM End Time:   9 AM  Provider/Observer:  Arley Phenix, Psy.D.       Clinical Neuropsychologist       Billing Code/Service: Neurobehavioral status exam  Chief Complaint:    Sean Rojas is a 37 year old male who was referred by Marveen Reeks at day [facility] for neuropsychological testing for issues related to attentional deficits possibly adult residual attention deficit disorder versus possible autistic spectrum disorder and/or other mood disorders including anxiety and depression.  Reason for Service:  Sean Rojas is a 37 year old male referred for neuropsychological evaluation.  The patient has had issues with attentional difficulties, depression, anxiety, hyperactivity, forgetfulness, anger outbursts, avoidance of being around others, expressive language fluency, and difficulties with memory.  The patient describes mood changes and getting lost while driving.  He describes slurring his speech at times and lack of empathy of others.  He has difficulty adjusting to changes and is constantly feeling tense and anxious.  The patient reports that he was never diagnosed before with formal issues.  The patient is always had issues with reading comprehension and reading issues in school.  He does report he was in a student in school but kept himself most of the time.  The patient reports that he has had ADHD most of his life.  Reports he is never been able to Sit Still in School.  The patient reports that he has times of depression and low interest as well as anger.  The patient reports that he will wake up short tempered and started to  feel overwhelmed and very easily agitated.  He reports that he gets anxious very quickly and has a short/harsh reaction to others.  He reports that his big difficulties with anxiety and agitation started in the third grade.  He reports that this tends to come and go and last for couple of days per weeks and then stopped it the patient reports that no family history is ever been diagnosed with bipolar disorder.  The patient reports that his son and daughter both are easy to anger and have been followed by psychiatry/psychology.  The patient reports that a number of medications have been tried through the years.  The patient reports that he was tried on an SSRI/SNRI but experienced significant sexual side effects and slowed him down from an energy standpoint.  The patient reports that he was tried on Seroquel but it "knocked him out."  The patient reports the trazodone just made him feel "cloudy."  He is currently taking Wellbutrin 1 mg twice daily but is not sure if it is helping much.  The patient reports that 300 mg of Wellbutrin tended to help the most but he began developing breathing issues.  The patient reports that he went to speech therapy as a child from kindergarten to the second grade.  The patient reports that he always had difficulty with sensory integration and reports that this started around kindergarten.  The patient reports that he could not stand certain sounds and that would trigger anxiety.  The patient reports that he does not like to be  touched or feel close tight to his skin.  The patient does describe significant sleep disturbance.  The major effects all of his hat is putting significant tension in his marriage and leads to angry outburst.  Behavioral Observation: Sean Rojas  presents as a 36 y.o.-year-old Right Caucasian Male who appeared his stated age. his dress was Appropriate and he was Well Groomed and his manners were Appropriate to the situation.  his participation was  indicative of Appropriate and Attentive behaviors.  There were not any physical disabilities noted.  he displayed an appropriate level of cooperation and motivation.     Interactions:    Active Appropriate  Attention:   within normal limits and attention span and concentration were age appropriate  Memory:   within normal limits; recent and remote memory intact  Visuo-spatial:  not examined  Speech (Volume):  normal  Speech:   normal;   Thought Process:  Coherent and Relevant  Though Content:  WNL; not suicidal and not homicidal  Orientation:   person, place, time/date and situation  Judgment:   Good  Planning:   Good  Affect:    Anxious and Depressed  Mood:    Anxious  Insight:   Good  Intelligence:   normal  Marital Status/Living: The patient was born in [place] and had 3 siblings.  Patient reports that he was born after normal pregnancy and delivery and weighed 9 pounds 8 ounces.  He did have asthma as a child.  The patient reports that he had developmental issues with regard to reading, spelling, attentional issues and hyperactivity.  He has some motor impairments in his right hand related to hand strength.  He has some coordination issues with his hands and difficulties maintaining grip strength.  The patient is married and has 2 children.  He has 3 other children with his ex-wife.  The patient does describe marital issues and stress due to his many issues.  He feels like his marriage is failing due to his anger issues.  He was married in 2011.  He was married once before for 4 years.  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  The patient's hobbies and interests include art and music.  The patient served in the [employer] between 2004 and 2005.  He was honorably discharged but was  discharged early due to a pre-existing condition in his knee.  His highest rank and rank at discharge was Private First Class.  He worked in Hydrologist.   Substance Use:  No concerns of substance abuse are reported.    Education:   The patient has his masters degree and had a 3.25 grade point average in college.  He has degrees from the [employer] and [employer] as well as his associates degree from [employer].  Medical History:   Past Medical History:  Diagnosis Date  . Asthma    as a child            Abuse/Trauma History: The patient does not report any history of abuse or trauma.  Psychiatric History:  The patient describes long-standing issues with interpersonal interactions and sensitivity to tactile sensations or various sounds.  The patient has had attentional issues, social issues, difficulty with reading comprehension even though he has his masters degree, and problems with depression and anxiety and attentional issues.  Family Med/Psych History: History reviewed. No pertinent family history.  Risk of Suicide/Violence: low the patient denies any suicidal or homicidal ideation but he does have issues with anger and quick temper.  Impression/DX:  Sean Rojas is a 37 year old male referred for neuropsychological evaluation.  The patient has had issues with attentional difficulties, depression, anxiety, hyperactivity, forgetfulness, anger outbursts, avoidance of being around others, expressive language fluency, and difficulties with memory.  The patient describes mood changes and getting lost while driving.  He describes slurring his speech at times and lack of empathy of others.  He has difficulty adjusting to changes and is constantly feeling tense and anxious.  The patient reports that he was never diagnosed before with formal issues.  The patient is always had issues with reading comprehension and reading issues in school.  He does report he was in a  student in school but kept himself most of the time.  The patient reports that he has had ADHD most of his life.  Reports he is never been able to Sit Still in School.  The patient reports that he has times of depression and low interest as well as anger.  The patient reports that he will wake up short tempered and started to feel overwhelmed and very easily agitated.  He reports that he gets anxious very quickly and has a short/harsh reaction to others.  He reports that his big difficulties with anxiety and agitation started in the third grade.  He reports that this tends to come and go and last for couple of days per weeks and then stopped it the patient reports that no family history is ever been diagnosed with bipolar disorder.  The patient reports that his son and daughter both are easy to anger and have been followed by psychiatry/psychology.  The patient reports that a number of medications have been tried through the years.  The patient reports that he was tried on an SSRI/SNRI but experienced significant sexual side effects and slowed him down from an energy standpoint.  The patient reports that he was tried on Seroquel but it "knocked him out."  The patient reports the trazodone just made him feel "cloudy."  He is currently taking Wellbutrin 1 mg twice daily but is not sure if it is helping much.  The patient reports that 300 mg of Wellbutrin tended to help the most but he began developing breathing issues.  The patient reports that he went to speech therapy as a child from kindergarten to the second grade.  The patient reports that he always had difficulty with sensory integration and reports that this started around kindergarten.  The patient reports that he could not stand certain sounds and that would trigger anxiety.  The patient reports that he does not like to be touched or feel close tight to his skin.  The patient does describe significant sleep disturbance.  The major effects all of his hat  is putting significant tension in his marriage and leads to angry outburst.   Disposition/Plan:  We have set the patient up for formal neuropsychological testing.  Initially we will administer the comprehensive attention battery as well as the CAB CPT measures to get an in-depth analysis and assessment of his attention consonant functioning.  At that point we may do further testing but we will be working on issues related to diagnostic question of adult residual attention deficit disorder versus mood disorder versus mild autistic spectrum disorder.  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

## 2018-03-11 ENCOUNTER — Ambulatory Visit: Payer: BLUE CROSS/BLUE SHIELD | Admitting: Psychology

## 2018-03-12 DIAGNOSIS — F411 Generalized anxiety disorder: Secondary | ICD-10-CM | POA: Diagnosis not present

## 2018-03-12 DIAGNOSIS — F331 Major depressive disorder, recurrent, moderate: Secondary | ICD-10-CM | POA: Diagnosis not present

## 2018-03-15 ENCOUNTER — Encounter: Payer: BLUE CROSS/BLUE SHIELD | Attending: Psychology | Admitting: Psychology

## 2018-03-15 ENCOUNTER — Encounter: Payer: Self-pay | Admitting: Psychology

## 2018-03-15 DIAGNOSIS — F908 Attention-deficit hyperactivity disorder, other type: Secondary | ICD-10-CM | POA: Insufficient documentation

## 2018-03-15 DIAGNOSIS — F39 Unspecified mood [affective] disorder: Secondary | ICD-10-CM | POA: Insufficient documentation

## 2018-03-18 DIAGNOSIS — S134XXA Sprain of ligaments of cervical spine, initial encounter: Secondary | ICD-10-CM | POA: Diagnosis not present

## 2018-03-18 DIAGNOSIS — S335XXA Sprain of ligaments of lumbar spine, initial encounter: Secondary | ICD-10-CM | POA: Diagnosis not present

## 2018-03-18 DIAGNOSIS — M546 Pain in thoracic spine: Secondary | ICD-10-CM | POA: Diagnosis not present

## 2018-03-19 ENCOUNTER — Encounter: Payer: Self-pay | Admitting: Psychology

## 2018-03-19 ENCOUNTER — Encounter (HOSPITAL_BASED_OUTPATIENT_CLINIC_OR_DEPARTMENT_OTHER): Payer: BLUE CROSS/BLUE SHIELD | Admitting: Psychology

## 2018-03-19 DIAGNOSIS — F908 Attention-deficit hyperactivity disorder, other type: Secondary | ICD-10-CM | POA: Diagnosis not present

## 2018-03-19 DIAGNOSIS — F411 Generalized anxiety disorder: Secondary | ICD-10-CM

## 2018-03-19 DIAGNOSIS — F39 Unspecified mood [affective] disorder: Secondary | ICD-10-CM | POA: Diagnosis not present

## 2018-03-19 DIAGNOSIS — F840 Autistic disorder: Secondary | ICD-10-CM

## 2018-03-19 DIAGNOSIS — F331 Major depressive disorder, recurrent, moderate: Secondary | ICD-10-CM | POA: Diagnosis not present

## 2018-03-19 NOTE — Progress Notes (Signed)
Patient:  Sean Rojas   DOB: 12/31/1980  MR Number: [id number]  Location: [facility] FOR PAIN AND REHABILITATIVE MEDICINE [facility] PHYSICAL MEDICINE AND REHABILITATION [address] [place], [address] [id number] [facility] [place] [address] Dept: [phone]  Start: 8 AM End: 9 AM  Provider/Observer:     Hershal Coria PsyD  Chief Complaint:      Chief Complaint  Patient presents with  . Anxiety  . Agitation  . Stress    Reason For Service:     Sean Rojas is a 37 year old male referred for neuropsychological evaluation.  The patient has had issues with attentional difficulties, depression, anxiety, hyperactivity, forgetfulness, anger outbursts, avoidance of being around others, expressive language fluency, and difficulties with memory.  The patient describes mood changes and getting lost while driving.  He describes slurring his speech at times and lack of empathy of others.  He has difficulty adjusting to changes and is constantly feeling tense and anxious.  The patient reports that he was never diagnosed before with formal issues.  The patient is always had issues with reading comprehension and reading issues in school.  He does report he was in a student in school but kept himself most of the time.  The patient reports that he has had ADHD most of his life.  Reports he is never been able to Sit Still in School.  The patient reports that he has times of depression and low interest as well as anger.  The patient reports that he will wake up short tempered and started to feel overwhelmed and very easily agitated.  He reports that he gets anxious very quickly and has a short/harsh reaction to others.  He reports that his big difficulties with anxiety and agitation started in the third grade.  He reports that this tends to come and go and last for couple of days per weeks and then stopped it the patient reports that no family history is ever been diagnosed with bipolar disorder.  The  patient reports that his son and daughter both are easy to anger and have been followed by psychiatry/psychology.  The patient reports that a number of medications have been tried through the years.  The patient reports that he was tried on an SSRI/SNRI but experienced significant sexual side effects and slowed him down from an energy standpoint.  The patient reports that he was tried on Seroquel but it "knocked him out."  The patient reports the trazodone just made him feel "cloudy."  He is currently taking Wellbutrin 1 mg twice daily but is not sure if it is helping much.  The patient reports that 300 mg of Wellbutrin tended to help the most but he began developing breathing issues.  The patient reports that he went to speech therapy as a child from kindergarten to the second grade.  The patient reports that he always had difficulty with sensory integration and reports that this started around kindergarten.  The patient reports that he could not stand certain sounds and that would trigger anxiety.  The patient reports that he does not like to be touched or feel close tight to his skin.  The patient does describe significant sleep disturbance.  The major effects all of his hat is putting significant tension in his marriage and leads to angry outburst.  Testing Administered:  The patient was administered the comprehensive attention battery and the CAB CPT measure.  The patient appeared to fully participate and tried through out the measure.  There was an issue with 1 of the measures related to administration but it appears that it only affected the last couple of trials and not the initial aspect of the measure this measure was the Stroop.  Participation Level:   Active  Participation Quality:  Appropriate and Attentive      Behavioral Observation:  Well Groomed, Alert, and Appropriate.   Test Results:   Initially, the patient was administered the auditory/visual reaction time test.  The patient's  accuracy scores were excellent as he correctly identified 50 of 50 targets for both visual and the auditory versions of this measure.  The patient is average response times were also equal to or better than normative comparisons.  The patient was then administered the discriminate reaction time measure.  This has 3 subtest for this measure including a visual measure, and auditory measure, and a measure that require shifting between visual and auditory targets.  On the visual discriminate reaction time measure the patient correctly identified 35 of 35 targets with 0 errors of omission and 0 errors of commission.  This is well within normative expectations.  The patient's response time was 429 ms which is also equal to or better than normative expectations.  On the auditory discriminate reaction time test the patient correctly identified 35 of 35 targets with 0 errors of omission and 0 errors of commission.  Average response times for the auditory measure were equal to or better than normative expectations.  The patient's performance on the mixed discriminate reaction time, which requires cognitive shifting of attention between either auditory or visual targets, was quite excellent.  The patient correctly identified 30 of 30 targets with 0 errors of commission and 0 errors of omission.  His average response time was well within normative expectations.  The patient was then administered the auditory/visual scan reaction time test.  As previous measures showed the patient did quite well.  The patient got 100% accuracy on the visual task, 100% accuracy on the auditory task, and 100% accuracy on the mix task which requires shifting of attention.  The patient's response time in all 3 measures was also excellent and equal to or better than normative expectations.  The patient was then administered the auditory/visual encoding test.  The patient's performance on both auditory and visual encoding were equal to or better  than normative expectations.  In fact on the auditory backwards the patient performed nearly 2 standard deviations better than normative comparisons group.  There was no indication of any encoding deficits.  The patient was then administered the auditory/visual multi processing test.  This is a very demanding multi processing task requiring some of the most demanding elements of attention and concentration and this entire battery.  The patient's performance ranged between 1 standard deviation in 1-1/2 standard deviations better than his normative comparison groups.  This was true for all 6 measures.  The patient showed no deficits at all with regard to multi processing abilities on this very demanding task.  The patient was then administered the Stroop interference cancellation test.  There was an error in the administration of this measure at the very end that was administrative error.  However, the patient's performance was quite good on the first for non-interference test and showed no deterioration in performance when the interference was applied.  The patient showed good shifting abilities and abilities to refrain from external distractors.  The patient was then administered the visual vigilance measure from the CAP CPT test.  This is  a 15-minute task that is broken down into 3-minute blocks of time for analysis.  On the first 3 minutes of this measure the patient had 100% accuracy target item responses.  The patient had 0 errors of omission and 0 errors of commission.  His average response time was 443 ms which is well within normative expectations.  The patient's performance remained generally consistent although by the 9 to 12-minute mark of time his average response time it slowed to some degree.  It was still within normative limits as far as the slowing.  As far as his accuracy across this visit to a minute task for correct responses his performance never deteriorate and he had 100% accuracy for  correct responses and had 0 errors of omission and 0 errors of commission throughout the entire 15-minute task.  This pattern of performance is not consistent with any indications of the deterioration of attention is a function of time.  Summary of Results:   The results of the current neuropsychological evaluation looking at a broad range of objective measures of attention and concentration and executive functioning were all equal to or better than normative expectations for his age and education matched peers group.  There were no indications of any areas of attentional deficits from an objective standpoint.  The patient showed excellent focus execute abilities, shift attention and focus, excellent sustained attention and concentration, and excellent freedom from external distractions.  This was true for both auditory and visual modalities.  Impression/Diagnosis:   The results of this objective neuropsychological evaluation including clinical interview and review of available medical records as well as objective assessment of a broad range of attention and concentration measures in both auditory and visual sensory modalities are not consistent with a diagnosis of adult residual attention deficit disorder.  The patient, in fact, was quite good and exceeded most of his normative comparison group on most of the measures administered.  However, looking at information derived from review of available medical records as well as a 1 hour face-to-face clinical interview, the patient does appear to clearly meet diagnostic considerations for a diagnosis of mild autistic spectrum disorder as well as what is often comorbid with this diagnosis of anxiety type symptoms including generalized anxiety disorder and obsessive-compulsive type traits.  The patient does tend to be easily agitated and overstimulated through both interactions with others as well as having a history of tactile overstimulation.  However, the patient  has progressed quite well through life and this would only fall in the mild range of autistic spectrum disorder.  I will be providing feedback regarding the results of the current neuropsychological evaluation with the patient.  As far as recommendations, I do not think that the patient should be tried on any type of psychostimulant medication as it is likely to exacerbate the actual symptoms that we would be trying to treat.  While the patient initially had difficulty with a previous trial of SSRI medication it may be worth trying other SSRIs to see if the side effects that he was having from those medications would not happen with other variance of SSRI.  My suspicion that the difficulty he had with higher doses of Wellbutrin related to breathing problems may have actually been an exacerbation of some of the anxiety and agitation from the stimulant type of affect of the Wellbutrin.  I will speak further with the patient around other issues and recommendations.  Diagnosis:    Axis I: Autistic spectrum disorder  Generalized anxiety disorder  Sean PhenixJohn Rojas, Psy.D. Neuropsychologist

## 2018-03-19 NOTE — Progress Notes (Signed)
Patient arrived on time to his 8:00am testing appointment, which lasted 2 hours. He was alert and oriented x4. Eye contact was poor. He appeared anxious and with some psychomotor agitation noted (e.g. constantly shaking right leg). He was cooperative and completed all assigned tasks. He became mildly frustrated on a task that required him to respond quickly to audio and visual targets presented on a computer screen, but demonstrated adequate distress tolerance overall. Rapport was slightly difficult to establish as patient appeared guarded. Attention was sustained throughout testing and he appeared to give good effort.

## 2018-03-26 DIAGNOSIS — F411 Generalized anxiety disorder: Secondary | ICD-10-CM | POA: Diagnosis not present

## 2018-03-26 DIAGNOSIS — F331 Major depressive disorder, recurrent, moderate: Secondary | ICD-10-CM | POA: Diagnosis not present

## 2018-04-02 DIAGNOSIS — F411 Generalized anxiety disorder: Secondary | ICD-10-CM | POA: Diagnosis not present

## 2018-04-02 DIAGNOSIS — F331 Major depressive disorder, recurrent, moderate: Secondary | ICD-10-CM | POA: Diagnosis not present

## 2018-04-07 DIAGNOSIS — M779 Enthesopathy, unspecified: Secondary | ICD-10-CM | POA: Diagnosis not present

## 2018-04-07 DIAGNOSIS — Z6825 Body mass index (BMI) 25.0-25.9, adult: Secondary | ICD-10-CM | POA: Diagnosis not present

## 2018-04-10 DIAGNOSIS — F331 Major depressive disorder, recurrent, moderate: Secondary | ICD-10-CM | POA: Diagnosis not present

## 2018-04-10 DIAGNOSIS — F411 Generalized anxiety disorder: Secondary | ICD-10-CM | POA: Diagnosis not present

## 2018-04-12 DIAGNOSIS — R4586 Emotional lability: Secondary | ICD-10-CM | POA: Diagnosis not present

## 2018-04-12 DIAGNOSIS — M779 Enthesopathy, unspecified: Secondary | ICD-10-CM | POA: Diagnosis not present

## 2018-04-12 DIAGNOSIS — F900 Attention-deficit hyperactivity disorder, predominantly inattentive type: Secondary | ICD-10-CM | POA: Diagnosis not present

## 2018-04-12 DIAGNOSIS — Z6825 Body mass index (BMI) 25.0-25.9, adult: Secondary | ICD-10-CM | POA: Diagnosis not present

## 2018-04-19 ENCOUNTER — Encounter: Payer: Self-pay | Admitting: Psychology

## 2018-04-20 DIAGNOSIS — Z6825 Body mass index (BMI) 25.0-25.9, adult: Secondary | ICD-10-CM | POA: Diagnosis not present

## 2018-04-20 DIAGNOSIS — M779 Enthesopathy, unspecified: Secondary | ICD-10-CM | POA: Diagnosis not present

## 2018-04-20 DIAGNOSIS — G5602 Carpal tunnel syndrome, left upper limb: Secondary | ICD-10-CM | POA: Diagnosis not present

## 2018-04-23 ENCOUNTER — Encounter: Payer: BLUE CROSS/BLUE SHIELD | Admitting: Psychology

## 2018-04-23 DIAGNOSIS — F411 Generalized anxiety disorder: Secondary | ICD-10-CM | POA: Diagnosis not present

## 2018-04-23 DIAGNOSIS — F331 Major depressive disorder, recurrent, moderate: Secondary | ICD-10-CM | POA: Diagnosis not present

## 2018-04-27 ENCOUNTER — Encounter: Payer: BLUE CROSS/BLUE SHIELD | Attending: Psychology | Admitting: Psychology

## 2018-04-27 DIAGNOSIS — F411 Generalized anxiety disorder: Secondary | ICD-10-CM

## 2018-04-27 DIAGNOSIS — F908 Attention-deficit hyperactivity disorder, other type: Secondary | ICD-10-CM | POA: Diagnosis not present

## 2018-04-27 DIAGNOSIS — Z6825 Body mass index (BMI) 25.0-25.9, adult: Secondary | ICD-10-CM | POA: Diagnosis not present

## 2018-04-27 DIAGNOSIS — F429 Obsessive-compulsive disorder, unspecified: Secondary | ICD-10-CM | POA: Diagnosis not present

## 2018-04-27 DIAGNOSIS — M779 Enthesopathy, unspecified: Secondary | ICD-10-CM | POA: Diagnosis not present

## 2018-04-27 DIAGNOSIS — F39 Unspecified mood [affective] disorder: Secondary | ICD-10-CM | POA: Insufficient documentation

## 2018-04-27 DIAGNOSIS — F840 Autistic disorder: Secondary | ICD-10-CM | POA: Diagnosis not present

## 2018-04-28 ENCOUNTER — Encounter: Payer: Self-pay | Admitting: Psychology

## 2018-04-28 NOTE — Progress Notes (Signed)
Today I provided feedback regarding the results of the recent neuropsychological evaluation.  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  Below is the summary and results sections from the recent evaluation.  Summary of Results:                        The results of the current neuropsychological evaluation looking at a broad range of objective measures of attention and concentration and executive functioning were all equal to or better than normative expectations for his age and education matched peers group.  There were no indications of any areas of attentional deficits from an objective standpoint.  The patient showed excellent focus execute abilities, shift attention and focus, excellent sustained attention and concentration, and excellent freedom from external distractions.  This was true for both auditory and visual modalities.  Impression/Diagnosis:                     The results of this objective neuropsychological evaluation including clinical interview and review of available medical records as well as objective assessment of a broad range of attention and concentration measures in both auditory and visual sensory modalities are not consistent with a diagnosis of adult residual attention deficit disorder.  The patient, in fact, was quite good and exceeded most of his normative comparison group on most of the measures administered.  However, looking at information derived from review of available medical records as well as a 1 hour face-to-face clinical interview, the patient does appear to clearly meet diagnostic considerations for a diagnosis of mild autistic spectrum disorder as well as what is often comorbid with this diagnosis of anxiety type symptoms including generalized anxiety disorder and  obsessive-compulsive type traits.  The patient does tend to be easily agitated and overstimulated through both interactions with others as well as having a history of tactile overstimulation.  However, the patient has progressed quite well through life and this would only fall in the mild range of autistic spectrum disorder.  I will be providing feedback regarding the results of the current neuropsychological evaluation with the patient.  As far as recommendations, I do not think that the patient should be tried on any type of psychostimulant medication as it is likely to exacerbate the actual symptoms that we would be trying to treat.  While the patient initially had difficulty with a previous trial of SSRI medication it may be worth trying other SSRIs to see if the side effects that he was having from those medications would not happen with other variance of SSRI.  My suspicion that the difficulty he had with higher doses of Wellbutrin related to breathing problems may have actually been an exacerbation of some of the anxiety and agitation from the stimulant type of affect of the Wellbutrin.  I will speak further with the patient around other issues and recommendations.  Diagnosis:                               Axis I: Autistic spectrum disorder  Generalized anxiety disorder   Arley Phenix, Psy.D. Neuropsychologist

## 2018-04-30 DIAGNOSIS — F331 Major depressive disorder, recurrent, moderate: Secondary | ICD-10-CM | POA: Diagnosis not present

## 2018-04-30 DIAGNOSIS — F411 Generalized anxiety disorder: Secondary | ICD-10-CM | POA: Diagnosis not present

## 2018-05-06 ENCOUNTER — Ambulatory Visit: Payer: BLUE CROSS/BLUE SHIELD | Admitting: Psychology

## 2018-05-11 ENCOUNTER — Ambulatory Visit: Payer: BLUE CROSS/BLUE SHIELD | Admitting: Psychology

## 2018-05-11 ENCOUNTER — Encounter

## 2018-05-11 DIAGNOSIS — M5412 Radiculopathy, cervical region: Secondary | ICD-10-CM | POA: Diagnosis not present

## 2018-05-11 DIAGNOSIS — M79642 Pain in left hand: Secondary | ICD-10-CM | POA: Diagnosis not present

## 2018-05-14 DIAGNOSIS — F411 Generalized anxiety disorder: Secondary | ICD-10-CM | POA: Diagnosis not present

## 2018-05-14 DIAGNOSIS — F331 Major depressive disorder, recurrent, moderate: Secondary | ICD-10-CM | POA: Diagnosis not present

## 2018-05-20 ENCOUNTER — Ambulatory Visit: Payer: BLUE CROSS/BLUE SHIELD | Admitting: Psychology

## 2018-05-20 ENCOUNTER — Encounter

## 2018-05-20 DIAGNOSIS — S335XXA Sprain of ligaments of lumbar spine, initial encounter: Secondary | ICD-10-CM | POA: Diagnosis not present

## 2018-05-20 DIAGNOSIS — S134XXA Sprain of ligaments of cervical spine, initial encounter: Secondary | ICD-10-CM | POA: Diagnosis not present

## 2018-05-20 DIAGNOSIS — M546 Pain in thoracic spine: Secondary | ICD-10-CM | POA: Diagnosis not present

## 2018-05-21 DIAGNOSIS — F411 Generalized anxiety disorder: Secondary | ICD-10-CM | POA: Diagnosis not present

## 2018-05-21 DIAGNOSIS — F331 Major depressive disorder, recurrent, moderate: Secondary | ICD-10-CM | POA: Diagnosis not present

## 2018-05-24 ENCOUNTER — Encounter

## 2018-05-24 ENCOUNTER — Ambulatory Visit: Payer: BLUE CROSS/BLUE SHIELD | Admitting: Psychology

## 2018-05-25 DIAGNOSIS — S335XXA Sprain of ligaments of lumbar spine, initial encounter: Secondary | ICD-10-CM | POA: Diagnosis not present

## 2018-05-25 DIAGNOSIS — M546 Pain in thoracic spine: Secondary | ICD-10-CM | POA: Diagnosis not present

## 2018-05-25 DIAGNOSIS — S134XXA Sprain of ligaments of cervical spine, initial encounter: Secondary | ICD-10-CM | POA: Diagnosis not present

## 2018-05-31 DIAGNOSIS — F331 Major depressive disorder, recurrent, moderate: Secondary | ICD-10-CM | POA: Diagnosis not present

## 2018-05-31 DIAGNOSIS — F411 Generalized anxiety disorder: Secondary | ICD-10-CM | POA: Diagnosis not present

## 2018-05-31 DIAGNOSIS — G5602 Carpal tunnel syndrome, left upper limb: Secondary | ICD-10-CM | POA: Diagnosis not present

## 2018-06-02 DIAGNOSIS — S335XXA Sprain of ligaments of lumbar spine, initial encounter: Secondary | ICD-10-CM | POA: Diagnosis not present

## 2018-06-02 DIAGNOSIS — S134XXA Sprain of ligaments of cervical spine, initial encounter: Secondary | ICD-10-CM | POA: Diagnosis not present

## 2018-06-02 DIAGNOSIS — M546 Pain in thoracic spine: Secondary | ICD-10-CM | POA: Diagnosis not present

## 2018-06-07 ENCOUNTER — Other Ambulatory Visit: Payer: Self-pay | Admitting: Physician Assistant

## 2018-06-07 DIAGNOSIS — M542 Cervicalgia: Secondary | ICD-10-CM

## 2018-06-08 DIAGNOSIS — S335XXA Sprain of ligaments of lumbar spine, initial encounter: Secondary | ICD-10-CM | POA: Diagnosis not present

## 2018-06-08 DIAGNOSIS — M546 Pain in thoracic spine: Secondary | ICD-10-CM | POA: Diagnosis not present

## 2018-06-08 DIAGNOSIS — S134XXA Sprain of ligaments of cervical spine, initial encounter: Secondary | ICD-10-CM | POA: Diagnosis not present

## 2018-06-09 DIAGNOSIS — F411 Generalized anxiety disorder: Secondary | ICD-10-CM | POA: Diagnosis not present

## 2018-06-09 DIAGNOSIS — F331 Major depressive disorder, recurrent, moderate: Secondary | ICD-10-CM | POA: Diagnosis not present

## 2018-06-15 ENCOUNTER — Ambulatory Visit
Admission: RE | Admit: 2018-06-15 | Discharge: 2018-06-15 | Disposition: A | Discharge status: Home / Self Care | Payer: BLUE CROSS/BLUE SHIELD | Source: Ambulatory Visit | Attending: Physician Assistant | Admitting: Physician Assistant

## 2018-06-15 DIAGNOSIS — M542 Cervicalgia: Secondary | ICD-10-CM

## 2018-06-15 DIAGNOSIS — R202 Paresthesia of skin: Secondary | ICD-10-CM | POA: Diagnosis not present

## 2018-06-16 DIAGNOSIS — F331 Major depressive disorder, recurrent, moderate: Secondary | ICD-10-CM | POA: Diagnosis not present

## 2018-06-16 DIAGNOSIS — M546 Pain in thoracic spine: Secondary | ICD-10-CM | POA: Diagnosis not present

## 2018-06-16 DIAGNOSIS — S134XXA Sprain of ligaments of cervical spine, initial encounter: Secondary | ICD-10-CM | POA: Diagnosis not present

## 2018-06-16 DIAGNOSIS — S335XXA Sprain of ligaments of lumbar spine, initial encounter: Secondary | ICD-10-CM | POA: Diagnosis not present

## 2018-06-16 DIAGNOSIS — F411 Generalized anxiety disorder: Secondary | ICD-10-CM | POA: Diagnosis not present

## 2018-06-17 DIAGNOSIS — G5602 Carpal tunnel syndrome, left upper limb: Secondary | ICD-10-CM | POA: Diagnosis not present

## 2018-06-21 DIAGNOSIS — S134XXA Sprain of ligaments of cervical spine, initial encounter: Secondary | ICD-10-CM | POA: Diagnosis not present

## 2018-06-21 DIAGNOSIS — M546 Pain in thoracic spine: Secondary | ICD-10-CM | POA: Diagnosis not present

## 2018-06-21 DIAGNOSIS — S335XXA Sprain of ligaments of lumbar spine, initial encounter: Secondary | ICD-10-CM | POA: Diagnosis not present

## 2018-06-22 DIAGNOSIS — F411 Generalized anxiety disorder: Secondary | ICD-10-CM | POA: Diagnosis not present

## 2018-06-22 DIAGNOSIS — F331 Major depressive disorder, recurrent, moderate: Secondary | ICD-10-CM | POA: Diagnosis not present

## 2018-06-29 DIAGNOSIS — S134XXA Sprain of ligaments of cervical spine, initial encounter: Secondary | ICD-10-CM | POA: Diagnosis not present

## 2018-06-29 DIAGNOSIS — M546 Pain in thoracic spine: Secondary | ICD-10-CM | POA: Diagnosis not present

## 2018-06-29 DIAGNOSIS — S335XXA Sprain of ligaments of lumbar spine, initial encounter: Secondary | ICD-10-CM | POA: Diagnosis not present

## 2018-06-29 DIAGNOSIS — F331 Major depressive disorder, recurrent, moderate: Secondary | ICD-10-CM | POA: Diagnosis not present

## 2018-06-29 DIAGNOSIS — F411 Generalized anxiety disorder: Secondary | ICD-10-CM | POA: Diagnosis not present

## 2018-07-05 DIAGNOSIS — M546 Pain in thoracic spine: Secondary | ICD-10-CM | POA: Diagnosis not present

## 2018-07-05 DIAGNOSIS — S134XXA Sprain of ligaments of cervical spine, initial encounter: Secondary | ICD-10-CM | POA: Diagnosis not present

## 2018-07-05 DIAGNOSIS — S335XXA Sprain of ligaments of lumbar spine, initial encounter: Secondary | ICD-10-CM | POA: Diagnosis not present

## 2018-07-07 DIAGNOSIS — F411 Generalized anxiety disorder: Secondary | ICD-10-CM | POA: Diagnosis not present

## 2018-07-07 DIAGNOSIS — F331 Major depressive disorder, recurrent, moderate: Secondary | ICD-10-CM | POA: Diagnosis not present

## 2018-07-12 DIAGNOSIS — M546 Pain in thoracic spine: Secondary | ICD-10-CM | POA: Diagnosis not present

## 2018-07-12 DIAGNOSIS — S335XXA Sprain of ligaments of lumbar spine, initial encounter: Secondary | ICD-10-CM | POA: Diagnosis not present

## 2018-07-12 DIAGNOSIS — S134XXA Sprain of ligaments of cervical spine, initial encounter: Secondary | ICD-10-CM | POA: Diagnosis not present

## 2018-07-16 DIAGNOSIS — F411 Generalized anxiety disorder: Secondary | ICD-10-CM | POA: Diagnosis not present

## 2018-07-16 DIAGNOSIS — F331 Major depressive disorder, recurrent, moderate: Secondary | ICD-10-CM | POA: Diagnosis not present

## 2018-07-19 DIAGNOSIS — S335XXA Sprain of ligaments of lumbar spine, initial encounter: Secondary | ICD-10-CM | POA: Diagnosis not present

## 2018-07-19 DIAGNOSIS — M546 Pain in thoracic spine: Secondary | ICD-10-CM | POA: Diagnosis not present

## 2018-07-19 DIAGNOSIS — S134XXA Sprain of ligaments of cervical spine, initial encounter: Secondary | ICD-10-CM | POA: Diagnosis not present

## 2018-07-23 DIAGNOSIS — F331 Major depressive disorder, recurrent, moderate: Secondary | ICD-10-CM | POA: Diagnosis not present

## 2018-07-23 DIAGNOSIS — F411 Generalized anxiety disorder: Secondary | ICD-10-CM | POA: Diagnosis not present

## 2018-07-26 DIAGNOSIS — Z6825 Body mass index (BMI) 25.0-25.9, adult: Secondary | ICD-10-CM | POA: Diagnosis not present

## 2018-07-26 DIAGNOSIS — M2550 Pain in unspecified joint: Secondary | ICD-10-CM | POA: Diagnosis not present

## 2018-07-26 DIAGNOSIS — F900 Attention-deficit hyperactivity disorder, predominantly inattentive type: Secondary | ICD-10-CM | POA: Diagnosis not present

## 2018-07-26 DIAGNOSIS — S134XXA Sprain of ligaments of cervical spine, initial encounter: Secondary | ICD-10-CM | POA: Diagnosis not present

## 2018-07-26 DIAGNOSIS — F411 Generalized anxiety disorder: Secondary | ICD-10-CM | POA: Diagnosis not present

## 2018-07-26 DIAGNOSIS — S335XXA Sprain of ligaments of lumbar spine, initial encounter: Secondary | ICD-10-CM | POA: Diagnosis not present

## 2018-07-26 DIAGNOSIS — M546 Pain in thoracic spine: Secondary | ICD-10-CM | POA: Diagnosis not present

## 2018-07-28 DIAGNOSIS — M779 Enthesopathy, unspecified: Secondary | ICD-10-CM | POA: Diagnosis not present

## 2018-07-28 DIAGNOSIS — F900 Attention-deficit hyperactivity disorder, predominantly inattentive type: Secondary | ICD-10-CM | POA: Diagnosis not present

## 2018-07-28 DIAGNOSIS — M2550 Pain in unspecified joint: Secondary | ICD-10-CM | POA: Diagnosis not present

## 2018-07-28 DIAGNOSIS — E559 Vitamin D deficiency, unspecified: Secondary | ICD-10-CM | POA: Diagnosis not present

## 2018-07-28 DIAGNOSIS — R5383 Other fatigue: Secondary | ICD-10-CM | POA: Diagnosis not present

## 2018-07-28 DIAGNOSIS — R5382 Chronic fatigue, unspecified: Secondary | ICD-10-CM | POA: Diagnosis not present

## 2018-07-28 DIAGNOSIS — F429 Obsessive-compulsive disorder, unspecified: Secondary | ICD-10-CM | POA: Diagnosis not present

## 2018-07-28 DIAGNOSIS — F411 Generalized anxiety disorder: Secondary | ICD-10-CM | POA: Diagnosis not present

## 2018-07-30 DIAGNOSIS — F331 Major depressive disorder, recurrent, moderate: Secondary | ICD-10-CM | POA: Diagnosis not present

## 2018-07-30 DIAGNOSIS — F411 Generalized anxiety disorder: Secondary | ICD-10-CM | POA: Diagnosis not present

## 2018-08-02 DIAGNOSIS — S335XXA Sprain of ligaments of lumbar spine, initial encounter: Secondary | ICD-10-CM | POA: Diagnosis not present

## 2018-08-02 DIAGNOSIS — S134XXA Sprain of ligaments of cervical spine, initial encounter: Secondary | ICD-10-CM | POA: Diagnosis not present

## 2018-08-02 DIAGNOSIS — M546 Pain in thoracic spine: Secondary | ICD-10-CM | POA: Diagnosis not present

## 2018-08-06 DIAGNOSIS — F331 Major depressive disorder, recurrent, moderate: Secondary | ICD-10-CM | POA: Diagnosis not present

## 2018-08-06 DIAGNOSIS — F411 Generalized anxiety disorder: Secondary | ICD-10-CM | POA: Diagnosis not present

## 2018-08-09 DIAGNOSIS — S335XXA Sprain of ligaments of lumbar spine, initial encounter: Secondary | ICD-10-CM | POA: Diagnosis not present

## 2018-08-09 DIAGNOSIS — M546 Pain in thoracic spine: Secondary | ICD-10-CM | POA: Diagnosis not present

## 2018-08-09 DIAGNOSIS — S134XXA Sprain of ligaments of cervical spine, initial encounter: Secondary | ICD-10-CM | POA: Diagnosis not present

## 2018-08-16 DIAGNOSIS — S134XXA Sprain of ligaments of cervical spine, initial encounter: Secondary | ICD-10-CM | POA: Diagnosis not present

## 2018-08-16 DIAGNOSIS — S335XXA Sprain of ligaments of lumbar spine, initial encounter: Secondary | ICD-10-CM | POA: Diagnosis not present

## 2018-08-16 DIAGNOSIS — M546 Pain in thoracic spine: Secondary | ICD-10-CM | POA: Diagnosis not present

## 2018-08-20 DIAGNOSIS — F411 Generalized anxiety disorder: Secondary | ICD-10-CM | POA: Diagnosis not present

## 2018-08-20 DIAGNOSIS — F331 Major depressive disorder, recurrent, moderate: Secondary | ICD-10-CM | POA: Diagnosis not present

## 2018-08-24 DIAGNOSIS — S134XXA Sprain of ligaments of cervical spine, initial encounter: Secondary | ICD-10-CM | POA: Diagnosis not present

## 2018-08-24 DIAGNOSIS — S335XXA Sprain of ligaments of lumbar spine, initial encounter: Secondary | ICD-10-CM | POA: Diagnosis not present

## 2018-08-24 DIAGNOSIS — M546 Pain in thoracic spine: Secondary | ICD-10-CM | POA: Diagnosis not present

## 2018-08-27 DIAGNOSIS — F411 Generalized anxiety disorder: Secondary | ICD-10-CM | POA: Diagnosis not present

## 2018-08-27 DIAGNOSIS — F331 Major depressive disorder, recurrent, moderate: Secondary | ICD-10-CM | POA: Diagnosis not present

## 2018-08-30 DIAGNOSIS — S134XXA Sprain of ligaments of cervical spine, initial encounter: Secondary | ICD-10-CM | POA: Diagnosis not present

## 2018-08-30 DIAGNOSIS — M546 Pain in thoracic spine: Secondary | ICD-10-CM | POA: Diagnosis not present

## 2018-08-30 DIAGNOSIS — S335XXA Sprain of ligaments of lumbar spine, initial encounter: Secondary | ICD-10-CM | POA: Diagnosis not present

## 2018-09-03 DIAGNOSIS — F331 Major depressive disorder, recurrent, moderate: Secondary | ICD-10-CM | POA: Diagnosis not present

## 2018-09-03 DIAGNOSIS — F411 Generalized anxiety disorder: Secondary | ICD-10-CM | POA: Diagnosis not present

## 2018-09-06 ENCOUNTER — Ambulatory Visit: Payer: BLUE CROSS/BLUE SHIELD | Admitting: "Endocrinology

## 2018-09-06 DIAGNOSIS — S134XXA Sprain of ligaments of cervical spine, initial encounter: Secondary | ICD-10-CM | POA: Diagnosis not present

## 2018-09-06 DIAGNOSIS — M546 Pain in thoracic spine: Secondary | ICD-10-CM | POA: Diagnosis not present

## 2018-09-06 DIAGNOSIS — S335XXA Sprain of ligaments of lumbar spine, initial encounter: Secondary | ICD-10-CM | POA: Diagnosis not present

## 2018-09-07 ENCOUNTER — Encounter (INDEPENDENT_AMBULATORY_CARE_PROVIDER_SITE_OTHER): Payer: Self-pay

## 2018-09-07 ENCOUNTER — Other Ambulatory Visit: Payer: Self-pay

## 2018-09-07 ENCOUNTER — Ambulatory Visit (INDEPENDENT_AMBULATORY_CARE_PROVIDER_SITE_OTHER): Payer: BC Managed Care – PPO | Admitting: "Endocrinology

## 2018-09-07 ENCOUNTER — Encounter: Payer: Self-pay | Admitting: "Endocrinology

## 2018-09-07 VITALS — BP 113/78 | HR 59 | Ht 66.0 in | Wt 163.0 lb

## 2018-09-07 DIAGNOSIS — R7989 Other specified abnormal findings of blood chemistry: Secondary | ICD-10-CM | POA: Diagnosis not present

## 2018-09-07 NOTE — Progress Notes (Signed)
Endocrinology Consult Note                                            09/07/2018, 9:06 AM   Subjective:    Patient ID: Sean Rojas, male    DOB: 1980-11-18, PCP Practice, [facility]   Past Medical History:  Diagnosis Date  . Asthma    as a child   Past Surgical History:  Procedure Laterality Date  . HERNIA REPAIR Left   . KNEE ARTHROPLASTY Right    Patella release and condoplasty  . MASS EXCISION Left 07/20/2015   Procedure: EXCISION SOFT TISSUE MASS 3 CM;  Surgeon: Franky MachoMark Jenkins, MD;  Location: [facility];  Service: General;  Laterality: Left;  Marland Kitchen. VASECTOMY     Social History   Socioeconomic History  . Marital status: Married    Spouse name: Not on file  . Number of children: Not on file  . Years of education: Not on file  . Highest education level: Not on file  Occupational History  . Not on file  Social Needs  . Financial resource strain: Not on file  . Food insecurity:    Worry: Not on file    Inability: Not on file  . Transportation needs:    Medical: Not on file    Non-medical: Not on file  Tobacco Use  . Smoking status: Former Smoker    Packs/day: 1.00    Years: 5.00    Pack years: 5.00    Last attempt to quit: 07/15/2008    Years since quitting: 10.1  . Smokeless tobacco: Never Used  Substance and Sexual Activity  . Alcohol use: Yes    Comment: occ  . Drug use: No  . Sexual activity: Not on file  Lifestyle  . Physical activity:    Days per week: Not on file    Minutes per session: Not on file  . Stress: Not on file  Relationships  . Social connections:    Talks on phone: Not on file    Gets together: Not on file    Attends religious service: Not on file    Active member of club or organization: Not on file    Attends meetings of clubs or organizations: Not on file    Relationship status: Not on file  Other Topics Concern  . Not on file  Social History Narrative  . Not on file   Family History  Problem Relation Age of Onset  .  Hypertension Mother   . Hyperlipidemia Mother   . Hypertension Father   . Cancer Father    Outpatient Encounter Medications as of 09/07/2018  Medication Sig  . guanFACINE (TENEX) 1 MG tablet Take 1 mg by mouth 2 (two) times a day.  Marland Kitchen. DM-Phenylephrine-Acetaminophen (ALKA-SELTZER PLS SINUS & COUGH PO) Take 1 capsule by mouth 2 (two) times daily as needed (sinus congestion).   No facility-administered encounter medications on file as of 09/07/2018.    ALLERGIES: Allergies  Allergen Reactions  . Propoxyphene Nausea Only  . Septra [Sulfamethoxazole-Trimethoprim]     Unknown reaction--childhood allergy    VACCINATION STATUS:  There is no immunization history on file for this patient.  HPI Sean Rojas is 38 y.o. male who presents today with a medical history as above. he is being seen in consultation for abnormal thyroid function tests requested by  [facility].  He was never diagnosed with thyroid dysfunction.  Recent lab work done for complaint of fatigue revealed low T3, official  results are not available to review.  He is not on any antithyroid treatment nor thyroid hormone supplement.  He has unidentified thyroid dysfunction in 1 of his grandparents.  He denies dysphagia, shortness of breath, nor voice change. -He denies palpitation, tremors, nor heat/cold intolerance.  Review of Systems  Constitutional: + steady weight , + fatigue, no subjective hyperthermia, no subjective hypothermia Eyes: no blurry vision, no xerophthalmia ENT: no sore throat, no nodules palpated in throat, no dysphagia/odynophagia, no hoarseness Cardiovascular: no Chest Pain, no Shortness of Breath, no palpitations, no leg swelling Respiratory: no cough, no shortness of breath Gastrointestinal: no Nausea/Vomiting/Diarhhea Musculoskeletal: no muscle/joint aches Skin: no rashes Neurological: no tremors, no numbness, no tingling, no dizziness Psychiatric: no depression, no anxiety  Objective:     BP 113/78   Pulse (!) 59   Ht 5\' 6"  (1.676 m)   Wt 163 lb (73.9 kg)   BMI 26.31 kg/m   Wt Readings from Last 3 Encounters:  09/07/18 163 lb (73.9 kg)  07/20/15 160 lb (72.6 kg)  07/16/15 158 lb (71.7 kg)    Physical Exam  Constitutional: + Slightly over weight for height, not in acute distress, normal state of mind Eyes: PERRLA, EOMI, no exophthalmos ENT: moist mucous membranes, no gross thyromegaly, no gross cervical lymphadenopathy Cardiovascular: normal precordial activity, Regular Rate and Rhythm, no Murmur/Rubs/Gallops Respiratory:  adequate breathing efforts, no gross chest deformity, Clear to auscultation bilaterally Gastrointestinal: abdomen soft, Non -tender, No distension, Bowel Sounds present, no gross organomegaly Musculoskeletal: no gross deformities, strength intact in all four extremities Skin: moist, warm, no rashes Neurological: no tremor with outstretched hands, Deep tendon reflexes normal in bilateral lower extremities.  CMP ( most recent) CMP     Component Value Date/Time   NA 142 07/16/2015 1000   K 4.8 07/16/2015 1000   CL 105 07/16/2015 1000   CO2 29 07/16/2015 1000   GLUCOSE 96 07/16/2015 1000   BUN 12 07/16/2015 1000   CREATININE 0.86 07/16/2015 1000   CALCIUM 9.3 07/16/2015 1000   GFRNONAA >60 07/16/2015 1000   GFRAA >60 07/16/2015 1000      Assessment & Plan:   1. Abnormal thyroid blood test  - Sean Rojas  is being seen at a kind request of [facility]. -His thyroid studies are not available to review.  He will be sent to lab for complete thyroid evaluation measuring TSH, free T4/free T3, and antithyroid antibodies.    -he will return in 1 week to review his repeat labs.   - I did not initiate any new prescriptions today. - I advised him  to maintain close follow up with [facility] for primary care needs.   - Time spent with the patient: 30 minutes, of which >50% was spent in obtaining information  about his symptoms, reviewing his previous labs/studies,  evaluations, and treatments, counseling him about his thyroid function, and developing a plan to confirm the diagnosis and long term treatment based on the latest standards of care/guidelines.    [name] participated in the discussions, expressed understanding, and voiced agreement with the above plans.  All questions were answered to his satisfaction. he is encouraged to contact clinic should he have any questions or concerns prior to his return visit.  Follow up plan: Return in about 1 week (around 09/14/2018) for Labs Today- Non-Fasting  Ok, Follow up with Pre-visit Labs.   Marquis LunchGebre Mata Rowen, MD [facility] [address] [place], [place] [address] Phone: [phone]  Fax: [phone]     09/07/2018, 9:06 AM  This note was partially dictated with voice recognition software. Similar sounding words can be transcribed inadequately or may not  be corrected upon review.

## 2018-09-08 LAB — THYROID PEROXIDASE ANTIBODY: Thyroperoxidase Ab SerPl-aCnc: 487 IU/mL — ABNORMAL HIGH (ref <9)

## 2018-09-08 LAB — T3, FREE: T3, Free: 2.8 pg/mL (ref 2.3–4.2)

## 2018-09-08 LAB — THYROGLOBULIN ANTIBODY: Thyroglobulin Ab: 639 IU/mL — ABNORMAL HIGH (ref <=1)

## 2018-09-08 LAB — TSH: TSH: 8.91 mIU/L — ABNORMAL HIGH (ref 0.40–4.50)

## 2018-09-08 LAB — T4, FREE: Free T4: 0.9 ng/dL (ref 0.8–1.8)

## 2018-09-10 DIAGNOSIS — F331 Major depressive disorder, recurrent, moderate: Secondary | ICD-10-CM | POA: Diagnosis not present

## 2018-09-10 DIAGNOSIS — F411 Generalized anxiety disorder: Secondary | ICD-10-CM | POA: Diagnosis not present

## 2018-09-15 DIAGNOSIS — M546 Pain in thoracic spine: Secondary | ICD-10-CM | POA: Diagnosis not present

## 2018-09-15 DIAGNOSIS — S335XXA Sprain of ligaments of lumbar spine, initial encounter: Secondary | ICD-10-CM | POA: Diagnosis not present

## 2018-09-15 DIAGNOSIS — S134XXA Sprain of ligaments of cervical spine, initial encounter: Secondary | ICD-10-CM | POA: Diagnosis not present

## 2018-09-16 ENCOUNTER — Ambulatory Visit: Payer: BC Managed Care – PPO | Admitting: "Endocrinology

## 2018-09-17 DIAGNOSIS — F331 Major depressive disorder, recurrent, moderate: Secondary | ICD-10-CM | POA: Diagnosis not present

## 2018-09-17 DIAGNOSIS — F411 Generalized anxiety disorder: Secondary | ICD-10-CM | POA: Diagnosis not present

## 2018-09-21 DIAGNOSIS — S335XXA Sprain of ligaments of lumbar spine, initial encounter: Secondary | ICD-10-CM | POA: Diagnosis not present

## 2018-09-21 DIAGNOSIS — M546 Pain in thoracic spine: Secondary | ICD-10-CM | POA: Diagnosis not present

## 2018-09-21 DIAGNOSIS — S134XXA Sprain of ligaments of cervical spine, initial encounter: Secondary | ICD-10-CM | POA: Diagnosis not present

## 2018-09-22 ENCOUNTER — Other Ambulatory Visit: Payer: Self-pay

## 2018-09-22 ENCOUNTER — Encounter: Payer: Self-pay | Admitting: "Endocrinology

## 2018-09-22 ENCOUNTER — Ambulatory Visit (INDEPENDENT_AMBULATORY_CARE_PROVIDER_SITE_OTHER): Payer: BC Managed Care – PPO | Admitting: "Endocrinology

## 2018-09-22 DIAGNOSIS — E063 Autoimmune thyroiditis: Secondary | ICD-10-CM

## 2018-09-22 DIAGNOSIS — E038 Other specified hypothyroidism: Secondary | ICD-10-CM | POA: Diagnosis not present

## 2018-09-22 MED ORDER — LEVOTHYROXINE SODIUM 50 MCG PO TABS: 50.0000 ug | ORAL_TABLET | Freq: Every day | ORAL | 3 refills | Status: DC

## 2018-09-22 NOTE — Patient Instructions (Addendum)
Hypothyroidism  Hypothyroidism is when the thyroid gland does not make enough of certain hormones (it is underactive). The thyroid gland is a small gland located in the lower front part of the neck, just in front of the windpipe (trachea). This gland makes hormones that help control how the body uses food for energy (metabolism) as well as how the heart and brain function. These hormones also play a role in keeping your bones strong. When the thyroid is underactive, it produces too little of the hormones thyroxine (T4) and triiodothyronine (T3). What are the causes? This condition may be caused by:  Hashimoto's disease. This is a disease in which the body's disease-fighting system (immune system) attacks the thyroid gland. This is the most common cause.  Viral infections.  Pregnancy.  Certain medicines.  Birth defects.  Past radiation treatments to the head or neck for cancer.  Past treatment with radioactive iodine.  Past exposure to radiation in the environment.  Past surgical removal of part or all of the thyroid.  Problems with a gland in the center of the brain (pituitary gland).  Lack of enough iodine in the diet. What increases the risk? You are more likely to develop this condition if:  You are male.  You have a family history of thyroid conditions.  You use a medicine called lithium.  You take medicines that affect the immune system (immunosuppressants). What are the signs or symptoms? Symptoms of this condition include:  Feeling as though you have no energy (lethargy).  Not being able to tolerate cold.  Weight gain that is not explained by a change in diet or exercise habits.  Lack of appetite.  Dry skin.  Coarse hair.  Menstrual irregularity.  Slowing of thought processes.  Constipation.  Sadness or depression. How is this diagnosed? This condition may be diagnosed based on:  Your symptoms, your medical history, and a physical exam.  Blood  tests.  How is this treated? This condition is treated with medicine that replaces the thyroid hormones that your body does not make. After you begin treatment, it may take several weeks for symptoms to go away. Follow these instructions at home:  Take the prescribed thyroid hormone as directed.  Keep all follow-up visits as told by your health care provider. This is important. ? As your condition improves, your dosage of thyroid hormone medicine may change. ? You will need to have blood tests regularly so that your health care provider can monitor your condition. Contact a health care provider if:  Your symptoms do not get better with treatment.  You are taking thyroid replacement medicine and you: ? Sweat a lot. ? Have tremors. ? Feel anxious. ? Lose weight rapidly. ? Cannot tolerate heat. ? Have emotional swings. ? Have diarrhea. ? Feel weak. Get help right away if you have:  Chest pain.  An irregular heartbeat.  A rapid heartbeat.  Difficulty breathing. Summary  Hypothyroidism is when the thyroid gland does not make enough of certain hormones (it is underactive).  When the thyroid is underactive, it produces too little of the hormones thyroxine (T4) and triiodothyronine (T3).  The most common cause is Hashimoto's disease, a disease in which the body's disease-fighting system (immune system) attacks the thyroid gland. The condition can also be caused by viral infections, medicine, pregnancy, or past radiation treatment to the head or neck.  Symptoms may include weight gain, dry skin, constipation, feeling as though you do not have energy, and not being able to tolerate  cold.  This condition is treated with medicine to replace the thyroid hormones that your body does not make. This information is not intended to replace advice given to you by your health care provider. Make sure you discuss any questions you have with your health care provider. Document Released:  03/17/2005 Document Revised: 02/25/2017 Document Reviewed: 02/25/2017 Elsevier Interactive Patient Education  2019 [employer].

## 2018-09-22 NOTE — Progress Notes (Signed)
09/22/2018, 11:22 AM                                Endocrinology Telehealth Visit Follow up Note -During COVID -19 Pandemic  I connected with Sean Rojas on 09/22/2018   by telephone and verified that I am speaking with the correct person using two identifiers. Sean Rojas, Sep 10, 1980. he has verbally consented to this visit. All issues noted in this document were discussed and addressed. The format was not optimal for physical exam.    Subjective:    Patient ID: Sean Rojas, male    DOB: October 14, 1980, PCP [facility]   Past Medical History:  Diagnosis Date  . Asthma    as a child   Past Surgical History:  Procedure Laterality Date  . HERNIA REPAIR Left   . KNEE ARTHROPLASTY Right    Patella release and condoplasty  . MASS EXCISION Left 07/20/2015   Procedure: EXCISION SOFT TISSUE MASS 3 CM;  Surgeon: Aviva Signs, MD;  Location: [facility];  Service: General;  Laterality: Left;  Marland Kitchen VASECTOMY     Social History   Socioeconomic History  . Marital status: Married    Spouse name: Not on file  . Number of children: Not on file  . Years of education: Not on file  . Highest education level: Not on file  Occupational History  . Not on file  Social Needs  . Financial resource strain: Not on file  . Food insecurity    Worry: Not on file    Inability: Not on file  . Transportation needs    Medical: Not on file    Non-medical: Not on file  Tobacco Use  . Smoking status: Former Smoker    Packs/day: 1.00    Years: 5.00    Pack years: 5.00    Quit date: 07/15/2008    Years since quitting: 10.1  . Smokeless tobacco: Never Used  Substance and Sexual Activity  . Alcohol use: Yes    Comment: occ  . Drug use: No  . Sexual activity: Not on file  Lifestyle  . Physical activity    Days per week: Not on file    Minutes per session: Not on file  . Stress: Not on file  Relationships  . Social Herbalist on  phone: Not on file    Gets together: Not on file    Attends religious service: Not on file    Active member of club or organization: Not on file    Attends meetings of clubs or organizations: Not on file    Relationship status: Not on file  Other Topics Concern  . Not on file  Social History Narrative  . Not on file   Family History  Problem Relation Age of Onset  . Hypertension Mother   . Hyperlipidemia Mother   . Hypertension Father   . Cancer Father    Outpatient Encounter Medications as of 09/22/2018  Medication Sig  . DM-Phenylephrine-Acetaminophen (ALKA-SELTZER PLS SINUS & COUGH PO) Take 1 capsule by mouth 2 (two) times daily as needed (sinus congestion).  Marland Kitchen guanFACINE (TENEX) 1 MG tablet Take  1 mg by mouth 2 (two) times a day.  . levothyroxine (SYNTHROID) 50 MCG tablet Take 1 tablet (50 mcg total) by mouth daily before breakfast.   No facility-administered encounter medications on file as of 09/22/2018.    ALLERGIES: Allergies  Allergen Reactions  . Propoxyphene Nausea Only  . Septra [Sulfamethoxazole-Trimethoprim]     Unknown reaction--childhood allergy    VACCINATION STATUS:  There is no immunization history on file for this patient.  HPI Sean Rojas is 38 y.o. male who presents today with a medical history as above. he is being engaged in telehealth in follow-up of abnormal thyroid function test.  He was sent for new set of full profile thyroid function test after he was seen in consultation for abnormal thyroid function test last visit.    He is not on any antithyroid treatment nor thyroid hormone supplement.  He has unidentified thyroid dysfunction in 1 of his grandparents.  He denies dysphagia, shortness of breath, nor voice change. -He denies palpitation, tremors, nor heat/cold intolerance.  He has no new complaints since last visit.  Review of Systems Limited as above.     Objective:    There were no vitals taken for this visit.  Wt Readings from  Last 3 Encounters:  09/07/18 163 lb (73.9 kg)  07/20/15 160 lb (72.6 kg)  07/16/15 158 lb (71.7 kg)     CMP     Component Value Date/Time   NA 142 07/16/2015 1000   K 4.8 07/16/2015 1000   CL 105 07/16/2015 1000   CO2 29 07/16/2015 1000   GLUCOSE 96 07/16/2015 1000   BUN 12 07/16/2015 1000   CREATININE 0.86 07/16/2015 1000   CALCIUM 9.3 07/16/2015 1000   GFRNONAA >60 07/16/2015 1000   GFRAA >60 07/16/2015 1000    Assessment & Plan:   1.  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  He will benefit from early initiation of thyroid hormone supplement.  I discussed and initiated levothyroxine 50 mcg p.o. every morning.   - We discussed about the correct intake of his thyroid hormone, on empty stomach at fasting, with water, separated by at least 30 minutes from breakfast and other medications,  and separated by more than 4 hours from calcium, iron, multivitamins, acid reflux medications (PPIs). -Patient is made aware of the fact that thyroid hormone replacement is needed for life, dose to be adjusted by periodic monitoring of thyroid function tests.   - I advised him  to maintain close follow up with [facility] for primary care needs.    Time for this visit: 15 minutes. Sean Rojas  participated in the discussions, expressed understanding, and voiced agreement with the above plans.  All questions were answered to his satisfaction. he is encouraged to contact clinic should he have any questions or concerns prior to his return visit.   Follow up plan: Return in about 3 months (around 12/23/2018) for Follow up with Pre-visit Labs.   Marquis LunchGebre Sean Spalla, MD [facility] [address] [place], [place] [address] Phone: [phone]  Fax: [phone]     09/22/2018, 11:22 AM  This note was partially dictated with voice  recognition software. Similar sounding words can be transcribed inadequately or may not  be corrected upon review.

## 2018-09-25 DIAGNOSIS — F411 Generalized anxiety disorder: Secondary | ICD-10-CM | POA: Diagnosis not present

## 2018-09-25 DIAGNOSIS — F331 Major depressive disorder, recurrent, moderate: Secondary | ICD-10-CM | POA: Diagnosis not present

## 2018-09-28 DIAGNOSIS — S134XXA Sprain of ligaments of cervical spine, initial encounter: Secondary | ICD-10-CM | POA: Diagnosis not present

## 2018-09-28 DIAGNOSIS — M546 Pain in thoracic spine: Secondary | ICD-10-CM | POA: Diagnosis not present

## 2018-09-28 DIAGNOSIS — S335XXA Sprain of ligaments of lumbar spine, initial encounter: Secondary | ICD-10-CM | POA: Diagnosis not present

## 2018-10-05 DIAGNOSIS — M546 Pain in thoracic spine: Secondary | ICD-10-CM | POA: Diagnosis not present

## 2018-10-05 DIAGNOSIS — S134XXA Sprain of ligaments of cervical spine, initial encounter: Secondary | ICD-10-CM | POA: Diagnosis not present

## 2018-10-05 DIAGNOSIS — S335XXA Sprain of ligaments of lumbar spine, initial encounter: Secondary | ICD-10-CM | POA: Diagnosis not present

## 2018-10-13 ENCOUNTER — Telehealth: Payer: Self-pay | Admitting: "Endocrinology

## 2018-10-13 DIAGNOSIS — S134XXA Sprain of ligaments of cervical spine, initial encounter: Secondary | ICD-10-CM | POA: Diagnosis not present

## 2018-10-13 DIAGNOSIS — M546 Pain in thoracic spine: Secondary | ICD-10-CM | POA: Diagnosis not present

## 2018-10-13 DIAGNOSIS — S335XXA Sprain of ligaments of lumbar spine, initial encounter: Secondary | ICD-10-CM | POA: Diagnosis not present

## 2018-10-13 NOTE — Telephone Encounter (Signed)
Patient said that he started on levothyroxine back on June 25 and in the last two weeks he has started to be very cold, freezing. He wants to know if this is a side effect

## 2018-10-13 NOTE — Telephone Encounter (Signed)
Routing to Dr Nida for Advice? 

## 2018-10-13 NOTE — Telephone Encounter (Signed)
Not a side effect, the opposite is expected. He may need a higher dose. He can do his TSH/Free T4 sooner than planned before.

## 2018-10-14 NOTE — Telephone Encounter (Signed)
Left voicemail explaining recommendation

## 2018-10-21 DIAGNOSIS — F4321 Adjustment disorder with depressed mood: Secondary | ICD-10-CM | POA: Diagnosis not present

## 2018-10-28 DIAGNOSIS — M546 Pain in thoracic spine: Secondary | ICD-10-CM | POA: Diagnosis not present

## 2018-10-28 DIAGNOSIS — S134XXA Sprain of ligaments of cervical spine, initial encounter: Secondary | ICD-10-CM | POA: Diagnosis not present

## 2018-10-28 DIAGNOSIS — S335XXA Sprain of ligaments of lumbar spine, initial encounter: Secondary | ICD-10-CM | POA: Diagnosis not present

## 2018-10-29 DIAGNOSIS — F331 Major depressive disorder, recurrent, moderate: Secondary | ICD-10-CM | POA: Diagnosis not present

## 2018-10-29 DIAGNOSIS — F411 Generalized anxiety disorder: Secondary | ICD-10-CM | POA: Diagnosis not present

## 2018-11-01 DIAGNOSIS — M545 Low back pain: Secondary | ICD-10-CM | POA: Diagnosis not present

## 2018-11-01 DIAGNOSIS — E063 Autoimmune thyroiditis: Secondary | ICD-10-CM | POA: Diagnosis not present

## 2018-11-01 DIAGNOSIS — F900 Attention-deficit hyperactivity disorder, predominantly inattentive type: Secondary | ICD-10-CM | POA: Diagnosis not present

## 2018-11-05 DIAGNOSIS — S134XXA Sprain of ligaments of cervical spine, initial encounter: Secondary | ICD-10-CM | POA: Diagnosis not present

## 2018-11-05 DIAGNOSIS — S335XXA Sprain of ligaments of lumbar spine, initial encounter: Secondary | ICD-10-CM | POA: Diagnosis not present

## 2018-11-05 DIAGNOSIS — M546 Pain in thoracic spine: Secondary | ICD-10-CM | POA: Diagnosis not present

## 2018-11-08 DIAGNOSIS — M546 Pain in thoracic spine: Secondary | ICD-10-CM | POA: Diagnosis not present

## 2018-11-08 DIAGNOSIS — S134XXA Sprain of ligaments of cervical spine, initial encounter: Secondary | ICD-10-CM | POA: Diagnosis not present

## 2018-11-08 DIAGNOSIS — S335XXA Sprain of ligaments of lumbar spine, initial encounter: Secondary | ICD-10-CM | POA: Diagnosis not present

## 2018-11-11 DIAGNOSIS — M546 Pain in thoracic spine: Secondary | ICD-10-CM | POA: Diagnosis not present

## 2018-11-11 DIAGNOSIS — S134XXA Sprain of ligaments of cervical spine, initial encounter: Secondary | ICD-10-CM | POA: Diagnosis not present

## 2018-11-11 DIAGNOSIS — S335XXA Sprain of ligaments of lumbar spine, initial encounter: Secondary | ICD-10-CM | POA: Diagnosis not present

## 2018-11-12 DIAGNOSIS — F331 Major depressive disorder, recurrent, moderate: Secondary | ICD-10-CM | POA: Diagnosis not present

## 2018-11-12 DIAGNOSIS — F411 Generalized anxiety disorder: Secondary | ICD-10-CM | POA: Diagnosis not present

## 2018-11-25 DIAGNOSIS — M546 Pain in thoracic spine: Secondary | ICD-10-CM | POA: Diagnosis not present

## 2018-11-25 DIAGNOSIS — S134XXA Sprain of ligaments of cervical spine, initial encounter: Secondary | ICD-10-CM | POA: Diagnosis not present

## 2018-11-25 DIAGNOSIS — S335XXA Sprain of ligaments of lumbar spine, initial encounter: Secondary | ICD-10-CM | POA: Diagnosis not present

## 2018-11-26 DIAGNOSIS — F331 Major depressive disorder, recurrent, moderate: Secondary | ICD-10-CM | POA: Diagnosis not present

## 2018-11-26 DIAGNOSIS — F411 Generalized anxiety disorder: Secondary | ICD-10-CM | POA: Diagnosis not present

## 2018-11-30 DIAGNOSIS — S335XXA Sprain of ligaments of lumbar spine, initial encounter: Secondary | ICD-10-CM | POA: Diagnosis not present

## 2018-11-30 DIAGNOSIS — S134XXA Sprain of ligaments of cervical spine, initial encounter: Secondary | ICD-10-CM | POA: Diagnosis not present

## 2018-11-30 DIAGNOSIS — M546 Pain in thoracic spine: Secondary | ICD-10-CM | POA: Diagnosis not present

## 2018-12-15 DIAGNOSIS — E038 Other specified hypothyroidism: Secondary | ICD-10-CM | POA: Diagnosis not present

## 2018-12-22 DIAGNOSIS — F411 Generalized anxiety disorder: Secondary | ICD-10-CM | POA: Diagnosis not present

## 2018-12-22 DIAGNOSIS — F331 Major depressive disorder, recurrent, moderate: Secondary | ICD-10-CM | POA: Diagnosis not present

## 2018-12-23 ENCOUNTER — Ambulatory Visit (INDEPENDENT_AMBULATORY_CARE_PROVIDER_SITE_OTHER): Payer: BC Managed Care – PPO | Admitting: "Endocrinology

## 2018-12-23 ENCOUNTER — Encounter: Payer: Self-pay | Admitting: "Endocrinology

## 2018-12-23 ENCOUNTER — Other Ambulatory Visit: Payer: Self-pay

## 2018-12-23 DIAGNOSIS — E063 Autoimmune thyroiditis: Secondary | ICD-10-CM | POA: Diagnosis not present

## 2018-12-23 DIAGNOSIS — E038 Other specified hypothyroidism: Secondary | ICD-10-CM

## 2018-12-23 MED ORDER — LEVOTHYROXINE SODIUM 75 MCG PO TABS: 75.0000 ug | ORAL_TABLET | Freq: Every day | ORAL | 4 refills | Status: DC

## 2018-12-23 NOTE — Progress Notes (Signed)
12/23/2018, 8:39 AM                                Endocrinology Telehealth Visit Follow up Note -During COVID -19 Pandemic  I connected with Sean Rojas on 12/23/2018   by telephone and verified that I am speaking with the correct person using two identifiers. Sean Rojas, Apr 03, 1980. he has verbally consented to this visit. All issues noted in this document were discussed and addressed. The format was not optimal for physical exam.    Subjective:    Patient ID: Sean Rojas, male    DOB: 1980/11/18, PCP Practice, [facility]   Past Medical History:  Diagnosis Date  . Asthma    as a child   Past Surgical History:  Procedure Laterality Date  . HERNIA REPAIR Left   . KNEE ARTHROPLASTY Right    Patella release and condoplasty  . MASS EXCISION Left 07/20/2015   Procedure: EXCISION SOFT TISSUE MASS 3 CM;  Surgeon: Aviva Signs, MD;  Location: [facility];  Service: General;  Laterality: Left;  Marland Kitchen VASECTOMY     Social History   Socioeconomic History  . Marital status: Married    Spouse name: Not on file  . Number of children: Not on file  . Years of education: Not on file  . Highest education level: Not on file  Occupational History  . Not on file  Social Needs  . Financial resource strain: Not on file  . Food insecurity    Worry: Not on file    Inability: Not on file  . Transportation needs    Medical: Not on file    Non-medical: Not on file  Tobacco Use  . Smoking status: Former Smoker    Packs/day: 1.00    Years: 5.00    Pack years: 5.00    Quit date: 07/15/2008    Years since quitting: 10.4  . Smokeless tobacco: Never Used  Substance and Sexual Activity  . Alcohol use: Yes    Comment: occ  . Drug use: No  . Sexual activity: Not on file  Lifestyle  . Physical activity    Days per week: Not on file    Minutes per session: Not on file  . Stress: Not on file  Relationships  . Social Herbalist on  phone: Not on file    Gets together: Not on file    Attends religious service: Not on file    Active member of club or organization: Not on file    Attends meetings of clubs or organizations: Not on file    Relationship status: Not on file  Other Topics Concern  . Not on file  Social History Narrative  . Not on file   Family History  Problem Relation Age of Onset  . Hypertension Mother   . Hyperlipidemia Mother   . Hypertension Father   . Cancer Father    Outpatient Encounter Medications as of 12/23/2018  Medication Sig  . DM-Phenylephrine-Acetaminophen (ALKA-SELTZER PLS SINUS & COUGH PO) Take 1 capsule by mouth 2 (two) times daily as needed (sinus congestion).  Marland Kitchen guanFACINE (TENEX) 1 MG tablet Take  1 mg by mouth 2 (two) times a day.  . levothyroxine (SYNTHROID) 75 MCG tablet Take 1 tablet (75 mcg total) by mouth daily before breakfast.  . [DISCONTINUED] levothyroxine (SYNTHROID) 50 MCG tablet Take 1 tablet (50 mcg total) by mouth daily before breakfast.   No facility-administered encounter medications on file as of 12/23/2018.    ALLERGIES: Allergies  Allergen Reactions  . Propoxyphene Nausea Only  . Septra [Sulfamethoxazole-Trimethoprim]     Unknown reaction--childhood allergy    VACCINATION STATUS:  There is no immunization history on file for this patient.  HPI Sean Rojas is 38 y.o. male who presents today with a medical history as above. he is being engaged in telehealth in follow-up of abnormal thyroid function test.  He was sent for new set of full profile thyroid function test after he was seen in consultation for abnormal thyroid function test last visit.    He is currently on levothyroxine 50 mcg p.o. daily before breakfast.  He reports compliance with medication.  His previsit labs show evidence of under replacement.   -He complains of blurry vision/dry eyes bilaterally.    He has unidentified thyroid dysfunction in 1 of his grandparents.  He denies dysphagia,  shortness of breath, nor voice change. -He denies palpitation, tremors, nor heat/cold intolerance.  .  Review of Systems Limited as above.     Objective:    There were no vitals taken for this visit.  Wt Readings from Last 3 Encounters:  09/07/18 163 lb (73.9 kg)  07/20/15 160 lb (72.6 kg)  07/16/15 158 lb (71.7 kg)     CMP     Component Value Date/Time   NA 142 07/16/2015 1000   K 4.8 07/16/2015 1000   CL 105 07/16/2015 1000   CO2 29 07/16/2015 1000   GLUCOSE 96 07/16/2015 1000   BUN 12 07/16/2015 1000   CREATININE 0.86 07/16/2015 1000   CALCIUM 9.3 07/16/2015 1000   GFRNONAA >60 07/16/2015 1000   GFRAA >60 07/16/2015 1000    Assessment & Plan:   1.  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  He has hypothyroidism due to Hashimoto's thyroiditis.  He would benefit from increase in his dose of levothyroxine.  I discussed increase levothyroxine to 75 mcg p.o. daily before breakfast.    - We discussed about the correct intake of his thyroid hormone, on empty stomach at fasting, with water, separated by at least 30 minutes from breakfast and other medications,  and separated by more than 4 hours from calcium, iron, multivitamins, acid reflux medications (PPIs). -Patient is made aware of the fact that thyroid hormone replacement is needed for life, dose to be adjusted by periodic monitoring of thyroid function tests. 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  He is advised to continue follow-up with his ophthalmologist if he does not see improvement in his vision.   - I advised him  to maintain close follow up with Practice, [facility] for primary care needs.   Time for this visit: 15 minutes. Sean HeightAaron Pew  participated in the discussions, expressed understanding, and voiced agreement with the above plans.  All questions were answered to his satisfaction. he is  encouraged to contact clinic should he have any questions or concerns prior to his return visit.    Follow up plan: Return in about 4 months (around 04/24/2019) for Follow up with Pre-visit Labs.   Marquis LunchGebre Mekhi Sonn, MD  [facility] [address] [place], [place] [address] Phone: [phone]  Fax: [phone]     12/23/2018, 8:39 AM  This note was partially dictated with voice recognition software. Similar sounding words can be transcribed inadequately or may not  be corrected upon review.

## 2019-01-04 DIAGNOSIS — F411 Generalized anxiety disorder: Secondary | ICD-10-CM | POA: Diagnosis not present

## 2019-01-04 DIAGNOSIS — F331 Major depressive disorder, recurrent, moderate: Secondary | ICD-10-CM | POA: Diagnosis not present

## 2019-01-07 ENCOUNTER — Other Ambulatory Visit: Payer: Self-pay | Admitting: "Endocrinology

## 2019-01-13 DIAGNOSIS — F4321 Adjustment disorder with depressed mood: Secondary | ICD-10-CM | POA: Diagnosis not present

## 2019-01-14 DIAGNOSIS — F331 Major depressive disorder, recurrent, moderate: Secondary | ICD-10-CM | POA: Diagnosis not present

## 2019-01-14 DIAGNOSIS — F411 Generalized anxiety disorder: Secondary | ICD-10-CM | POA: Diagnosis not present

## 2019-01-31 DIAGNOSIS — M545 Low back pain: Secondary | ICD-10-CM | POA: Diagnosis not present

## 2019-01-31 DIAGNOSIS — Z6826 Body mass index (BMI) 26.0-26.9, adult: Secondary | ICD-10-CM | POA: Diagnosis not present

## 2019-01-31 DIAGNOSIS — F900 Attention-deficit hyperactivity disorder, predominantly inattentive type: Secondary | ICD-10-CM | POA: Diagnosis not present

## 2019-02-04 DIAGNOSIS — F331 Major depressive disorder, recurrent, moderate: Secondary | ICD-10-CM | POA: Diagnosis not present

## 2019-02-04 DIAGNOSIS — F411 Generalized anxiety disorder: Secondary | ICD-10-CM | POA: Diagnosis not present

## 2019-02-11 DIAGNOSIS — F331 Major depressive disorder, recurrent, moderate: Secondary | ICD-10-CM | POA: Diagnosis not present

## 2019-02-11 DIAGNOSIS — F411 Generalized anxiety disorder: Secondary | ICD-10-CM | POA: Diagnosis not present

## 2019-02-17 DIAGNOSIS — J019 Acute sinusitis, unspecified: Secondary | ICD-10-CM | POA: Diagnosis not present

## 2019-02-18 DIAGNOSIS — F411 Generalized anxiety disorder: Secondary | ICD-10-CM | POA: Diagnosis not present

## 2019-02-18 DIAGNOSIS — F331 Major depressive disorder, recurrent, moderate: Secondary | ICD-10-CM | POA: Diagnosis not present

## 2019-03-02 DIAGNOSIS — J019 Acute sinusitis, unspecified: Secondary | ICD-10-CM | POA: Diagnosis not present

## 2019-03-04 DIAGNOSIS — F331 Major depressive disorder, recurrent, moderate: Secondary | ICD-10-CM | POA: Diagnosis not present

## 2019-03-04 DIAGNOSIS — F411 Generalized anxiety disorder: Secondary | ICD-10-CM | POA: Diagnosis not present

## 2019-03-14 DIAGNOSIS — F411 Generalized anxiety disorder: Secondary | ICD-10-CM | POA: Diagnosis not present

## 2019-03-14 DIAGNOSIS — F331 Major depressive disorder, recurrent, moderate: Secondary | ICD-10-CM | POA: Diagnosis not present

## 2019-03-23 DIAGNOSIS — J329 Chronic sinusitis, unspecified: Secondary | ICD-10-CM | POA: Diagnosis not present

## 2019-03-23 DIAGNOSIS — F331 Major depressive disorder, recurrent, moderate: Secondary | ICD-10-CM | POA: Diagnosis not present

## 2019-03-23 DIAGNOSIS — F411 Generalized anxiety disorder: Secondary | ICD-10-CM | POA: Diagnosis not present

## 2019-04-08 DIAGNOSIS — F411 Generalized anxiety disorder: Secondary | ICD-10-CM | POA: Diagnosis not present

## 2019-04-08 DIAGNOSIS — F331 Major depressive disorder, recurrent, moderate: Secondary | ICD-10-CM | POA: Diagnosis not present

## 2019-04-15 DIAGNOSIS — F331 Major depressive disorder, recurrent, moderate: Secondary | ICD-10-CM | POA: Diagnosis not present

## 2019-04-15 DIAGNOSIS — F411 Generalized anxiety disorder: Secondary | ICD-10-CM | POA: Diagnosis not present

## 2019-04-20 DIAGNOSIS — R05 Cough: Secondary | ICD-10-CM | POA: Diagnosis not present

## 2019-04-20 DIAGNOSIS — E038 Other specified hypothyroidism: Secondary | ICD-10-CM | POA: Diagnosis not present

## 2019-04-20 LAB — TSH: TSH: 4.7 (ref 0.41–5.90)

## 2019-04-22 DIAGNOSIS — F331 Major depressive disorder, recurrent, moderate: Secondary | ICD-10-CM | POA: Diagnosis not present

## 2019-04-22 DIAGNOSIS — F411 Generalized anxiety disorder: Secondary | ICD-10-CM | POA: Diagnosis not present

## 2019-04-26 ENCOUNTER — Encounter: Payer: Self-pay | Admitting: "Endocrinology

## 2019-04-26 ENCOUNTER — Ambulatory Visit (INDEPENDENT_AMBULATORY_CARE_PROVIDER_SITE_OTHER): Payer: BC Managed Care – PPO | Admitting: "Endocrinology

## 2019-04-26 DIAGNOSIS — E063 Autoimmune thyroiditis: Secondary | ICD-10-CM | POA: Diagnosis not present

## 2019-04-26 DIAGNOSIS — E038 Other specified hypothyroidism: Secondary | ICD-10-CM | POA: Diagnosis not present

## 2019-04-26 MED ORDER — LEVOTHYROXINE SODIUM 100 MCG PO TABS: 100.0000 ug | ORAL_TABLET | Freq: Every day | ORAL | 3 refills | Status: DC

## 2019-04-26 NOTE — Progress Notes (Signed)
04/26/2019, 9:30 PM                                Endocrinology Telehealth Visit Follow up Note -During COVID -19 Pandemic  I connected with Sean Rojas on 04/26/2019   by telephone and verified that I am speaking with the correct person using two identifiers. Sean Rojas, February 01, 1981. he has verbally consented to this visit. All issues noted in this document were discussed and addressed. The format was not optimal for physical exam.    Subjective:    Patient ID: Sean Rojas, male    DOB: June 20, 1980, PCP Practice, [facility]   Past Medical History:  Diagnosis Date  . Asthma    as a child   Past Surgical History:  Procedure Laterality Date  . HERNIA REPAIR Left   . KNEE ARTHROPLASTY Right    Patella release and condoplasty  . MASS EXCISION Left 07/20/2015   Procedure: EXCISION SOFT TISSUE MASS 3 CM;  Surgeon: Franky Macho, MD;  Location: [facility];  Service: General;  Laterality: Left;  Marland Kitchen VASECTOMY     Social History   Socioeconomic History  . Marital status: Married    Spouse name: Not on file  . Number of children: Not on file  . Years of education: Not on file  . Highest education level: Not on file  Occupational History  . Not on file  Tobacco Use  . Smoking status: Former Smoker    Packs/day: 1.00    Years: 5.00    Pack years: 5.00    Quit date: 07/15/2008    Years since quitting: 10.7  . Smokeless tobacco: Never Used  Substance and Sexual Activity  . Alcohol use: Yes    Comment: occ  . Drug use: No  . Sexual activity: Not on file  Other Topics Concern  . Not on file  Social History Narrative  . Not on file   Social Determinants of Health   Financial Resource Strain:   . Difficulty of Paying Living Expenses: Not on file  Food Insecurity:   . Worried About Programme researcher, broadcasting/film/video in the Last Year: Not on file  . Ran Out of Food in the Last Year: Not on file  Transportation Needs:   . Lack of  Transportation (Medical): Not on file  . Lack of Transportation (Non-Medical): Not on file  Physical Activity:   . Days of Exercise per Week: Not on file  . Minutes of Exercise per Session: Not on file  Stress:   . Feeling of Stress : Not on file  Social Connections:   . Frequency of Communication with Friends and Family: Not on file  . Frequency of Social Gatherings with Friends and Family: Not on file  . Attends Religious Services: Not on file  . Active Member of Clubs or Organizations: Not on file  . Attends Banker Meetings: Not on file  . Marital Status: Not on file   Family History  Problem Relation Age of Onset  . Hypertension Mother   . Hyperlipidemia Mother   . Hypertension Father   . Cancer Father    Outpatient Encounter Medications  as of 04/26/2019  Medication Sig  . DM-Phenylephrine-Acetaminophen (ALKA-SELTZER PLS SINUS & COUGH PO) Take 1 capsule by mouth 2 (two) times daily as needed (sinus congestion).  Marland Kitchen guanFACINE (TENEX) 1 MG tablet Take 1 mg by mouth 2 (two) times a day.  . levothyroxine (SYNTHROID) 100 MCG tablet Take 1 tablet (100 mcg total) by mouth daily before breakfast.  . [DISCONTINUED] levothyroxine (SYNTHROID) 50 MCG tablet TAKE 1 TABLET BY MOUTH DAILY BEFORE BREAKFAST  . [DISCONTINUED] levothyroxine (SYNTHROID) 75 MCG tablet Take 1 tablet (75 mcg total) by mouth daily before breakfast.   No facility-administered encounter medications on file as of 04/26/2019.   ALLERGIES: Allergies  Allergen Reactions  . Propoxyphene Nausea Only  . Septra [Sulfamethoxazole-Trimethoprim]     Unknown reaction--childhood allergy    VACCINATION STATUS:  There is no immunization history on file for this patient.  HPI Sean Rojas is 39 y.o. male who presents today with a medical history as above. he is being engaged in telehealth in follow-up of hypothyroidism associated with Hashimoto's thyroiditis.  He was initiated on levothyroxine which was advised to  his current dose of 75 mcg p.o. daily before breakfast.  He reports compliance with medication.  He reports significant improvement in some of his symptoms, including his energy level.  He continues to gain weight reportedly, mainly due to gym workout.  He complains of shortness of breath, has history of prior asthma attack.  He is currently awaiting evaluation by pulmonologist.    His previsit labs show evidence of under replacement.   -He complains of blurry vision/dry eyes bilaterally.    He has unidentified thyroid dysfunction in 1 of his grandparents.  He denies dysphagia, shortness of breath, nor voice change. -He denies palpitation, tremors, nor heat/cold intolerance.  .  Review of Systems Limited as above.   Objective:    There were no vitals taken for this visit.  Wt Readings from Last 3 Encounters:  09/07/18 163 lb (73.9 kg)  07/20/15 160 lb (72.6 kg)  07/16/15 158 lb (71.7 kg)     CMP     Component Value Date/Time   NA 142 07/16/2015 1000   K 4.8 07/16/2015 1000   CL 105 07/16/2015 1000   CO2 29 07/16/2015 1000   GLUCOSE 96 07/16/2015 1000   BUN 12 07/16/2015 1000   CREATININE 0.86 07/16/2015 1000   CALCIUM 9.3 07/16/2015 1000   GFRNONAA >60 07/16/2015 1000   GFRAA >60 07/16/2015 1000   Recent Results (from the past 2160 hour(s))  TSH     Status: None   Collection Time: 04/20/19 12:00 AM  Result Value Ref Range   TSH 4.70 0.41 - 5.90    Comment: free t4 1.3    Assessment & Plan:   1.  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   He has hypothyroidism due to Hashimoto's thyroiditis.  He will benefit from higher dose of levothyroxine.  I discussed and increase his levothyroxine to 100 mcg p.o. daily before breakfast.    - We discussed about the correct intake of his thyroid hormone, on empty stomach at fasting, with water, separated by at least 30 minutes from breakfast and other  medications,  and separated by more than 4 hours from calcium, iron, multivitamins, acid reflux medications (PPIs). -Patient is made aware of the fact that thyroid hormone replacement is needed for life, dose to be adjusted by periodic monitoring of thyroid function tests.  He complains of unidentified shortness of breath, he is offered baseline thyroid/neck ultrasound.   - I advised him  to maintain close follow up with Practice, [facility] for primary care needs.      - Time spent on this patient care encounter:  25 minutes of which 50% was spent in  counseling and the rest reviewing  his current and  previous labs / studies and medications  doses and developing a plan for long term care. Sean Rojas  participated in the discussions, expressed understanding, and voiced agreement with the above plans.  All questions were answered to his satisfaction. he is encouraged to contact clinic should he have any questions or concerns prior to his return visit.  Follow up plan: Return in about 3 months (around 07/25/2019) for Follow up with Pre-visit Labs, Thyroid / Neck Ultrasound.   Marquis Lunch, MD [facility] [address] [place], [place] [address] Phone: [phone]  Fax: [phone]     04/26/2019, 9:30 PM  This note was partially dictated with voice recognition software. Similar sounding words can be transcribed inadequately or may not  be corrected upon review.

## 2019-04-27 ENCOUNTER — Other Ambulatory Visit: Payer: Self-pay

## 2019-04-27 MED ORDER — LEVOTHYROXINE SODIUM 100 MCG PO TABS: 100.0000 ug | ORAL_TABLET | Freq: Every day | ORAL | 3 refills | Status: DC

## 2019-04-28 ENCOUNTER — Ambulatory Visit: Payer: BC Managed Care – PPO | Admitting: "Endocrinology

## 2019-04-29 DIAGNOSIS — F411 Generalized anxiety disorder: Secondary | ICD-10-CM | POA: Diagnosis not present

## 2019-04-29 DIAGNOSIS — F331 Major depressive disorder, recurrent, moderate: Secondary | ICD-10-CM | POA: Diagnosis not present

## 2019-05-04 ENCOUNTER — Other Ambulatory Visit: Payer: Self-pay

## 2019-05-04 ENCOUNTER — Ambulatory Visit (HOSPITAL_COMMUNITY)
Admission: RE | Admit: 2019-05-04 | Discharge: 2019-05-04 | Disposition: A | Discharge status: Home / Self Care | Payer: BC Managed Care – PPO | Source: Ambulatory Visit | Attending: "Endocrinology | Admitting: "Endocrinology

## 2019-05-04 DIAGNOSIS — E038 Other specified hypothyroidism: Secondary | ICD-10-CM | POA: Diagnosis not present

## 2019-05-04 DIAGNOSIS — E039 Hypothyroidism, unspecified: Secondary | ICD-10-CM | POA: Diagnosis not present

## 2019-05-04 DIAGNOSIS — E063 Autoimmune thyroiditis: Secondary | ICD-10-CM | POA: Diagnosis not present

## 2019-05-06 DIAGNOSIS — F331 Major depressive disorder, recurrent, moderate: Secondary | ICD-10-CM | POA: Diagnosis not present

## 2019-05-06 DIAGNOSIS — F411 Generalized anxiety disorder: Secondary | ICD-10-CM | POA: Diagnosis not present

## 2019-05-13 DIAGNOSIS — F331 Major depressive disorder, recurrent, moderate: Secondary | ICD-10-CM | POA: Diagnosis not present

## 2019-05-13 DIAGNOSIS — F411 Generalized anxiety disorder: Secondary | ICD-10-CM | POA: Diagnosis not present

## 2019-05-27 DIAGNOSIS — F331 Major depressive disorder, recurrent, moderate: Secondary | ICD-10-CM | POA: Diagnosis not present

## 2019-05-27 DIAGNOSIS — F411 Generalized anxiety disorder: Secondary | ICD-10-CM | POA: Diagnosis not present

## 2019-05-30 DIAGNOSIS — Z6826 Body mass index (BMI) 26.0-26.9, adult: Secondary | ICD-10-CM | POA: Diagnosis not present

## 2019-05-30 DIAGNOSIS — J019 Acute sinusitis, unspecified: Secondary | ICD-10-CM | POA: Diagnosis not present

## 2019-06-03 DIAGNOSIS — F411 Generalized anxiety disorder: Secondary | ICD-10-CM | POA: Diagnosis not present

## 2019-06-03 DIAGNOSIS — F331 Major depressive disorder, recurrent, moderate: Secondary | ICD-10-CM | POA: Diagnosis not present

## 2019-06-07 ENCOUNTER — Ambulatory Visit (INDEPENDENT_AMBULATORY_CARE_PROVIDER_SITE_OTHER): Payer: BC Managed Care – PPO | Admitting: Emergency Medicine

## 2019-06-07 ENCOUNTER — Encounter: Payer: Self-pay | Admitting: Emergency Medicine

## 2019-06-07 ENCOUNTER — Other Ambulatory Visit: Payer: Self-pay

## 2019-06-07 ENCOUNTER — Ambulatory Visit (INDEPENDENT_AMBULATORY_CARE_PROVIDER_SITE_OTHER): Payer: BC Managed Care – PPO

## 2019-06-07 VITALS — BP 110/72 | HR 72 | Temp 98.0°F | Ht 66.0 in | Wt 168.0 lb

## 2019-06-07 DIAGNOSIS — J4520 Mild intermittent asthma, uncomplicated: Secondary | ICD-10-CM

## 2019-06-07 DIAGNOSIS — R0602 Shortness of breath: Secondary | ICD-10-CM

## 2019-06-07 DIAGNOSIS — R0600 Dyspnea, unspecified: Secondary | ICD-10-CM

## 2019-06-07 DIAGNOSIS — J309 Allergic rhinitis, unspecified: Secondary | ICD-10-CM | POA: Insufficient documentation

## 2019-06-07 DIAGNOSIS — J301 Allergic rhinitis due to pollen: Secondary | ICD-10-CM

## 2019-06-07 DIAGNOSIS — J45909 Unspecified asthma, uncomplicated: Secondary | ICD-10-CM | POA: Insufficient documentation

## 2019-06-07 NOTE — Assessment & Plan Note (Signed)
He carries a history of mild asthma but he is shortness of breath is characterized more by stuttering breathing, needing to sign order to get a deep breath in.  This is more reminiscent of a restrictive process.  He has had some ups and downs in his weight but at 168 pounds doubt that this is significant contributor to restriction.  He has a history of Hashimoto's thyroiditis, is at some risk for other autoimmune processes including possible myasthenia or MS although no other sequela of either of these diseases noted on history.  Believe he needs full PFT along with MIP, MEP to assess for obstruction, restriction, respiratory muscle weakness.  Further work-up depending on these results.  Chest x-ray today to compare with prior.

## 2019-06-07 NOTE — Progress Notes (Signed)
   Subjective:    Patient ID: Sean Rojas, male    DOB: [birth year], 39 y.o.   MRN: [id number]  HPI    Review of Systems  Constitutional: Negative for fever and unexpected weight change.  HENT: Negative for congestion, dental problem, ear pain, nosebleeds, postnasal drip, rhinorrhea, sinus pressure, sneezing, sore throat and trouble swallowing.   Eyes: Negative for redness and itching.  Respiratory: Positive for chest tightness, shortness of breath and wheezing. Negative for cough.   Cardiovascular: Negative for palpitations and leg swelling.  Gastrointestinal: Negative for nausea and vomiting.  Genitourinary: Negative for dysuria.  Musculoskeletal: Negative for joint swelling.  Skin: Negative for rash.  Allergic/Immunologic: Positive for environmental allergies. Negative for food allergies and immunocompromised state.  Neurological: Negative for headaches.  Hematological: Does not bruise/bleed easily.  Psychiatric/Behavioral: Negative for dysphoric mood. The patient is nervous/anxious.        Objective:   Physical Exam        Assessment & Plan:

## 2019-06-07 NOTE — Assessment & Plan Note (Signed)
Persistent clear rhinitis with some associated clear sputum since an apparent viral syndrome in late December which appears to have cleared.  He does have seasonal allergies, just started fluticasone nasal spray last week.  I had like for him to continue this, and his seasonal loratadine now to see if he gets benefit.

## 2019-06-07 NOTE — Progress Notes (Signed)
Subjective:    Patient ID: Sean Rojas, male    DOB: 1980/09/07, 39 y.o.   MRN: [id number]  HPI 39 year old man, former minimal smoker (5 pack years) with a history of childhood asthma with continued symptoms into highschool. Then began to have symptoms again in late 20's, Hx Hashimoto's thyroiditis now with hypothyroidism, remote right-sided pleurisy.  Referred today for evaluation of sinus congestion and asthma, dyspnea.   He describes the dyspnea as difficulty taking a deep breath, can bother him through the day. Feels like a "catch" when he is trying to take a deep breath. Doesn't seem to happen w exertion. Rarely hears wheeze. Has some intermittent cough. Had congestion and possible URI sx in late Dec / early Jan >> was later found to have COVID antibodies. He was recently treated again for a possible sinusitis. Started flonase last week. Uses loratadine during allergy season, not currently.   He describes some hand weakness in the past, has had nerve conduction studies in the past. No hx MS. Weight has been stable.   Review of Systems  Constitutional: Negative for fever and unexpected weight change.  HENT: Negative for congestion, dental problem, ear pain, nosebleeds, postnasal drip, rhinorrhea, sinus pressure, sneezing, sore throat and trouble swallowing.  Eyes: Negative for redness and itching.  Respiratory: Positive for chest tightness, shortness of breath and wheezing. Negative for cough.  Cardiovascular: Negative for palpitations and leg swelling.  Gastrointestinal: Negative for nausea and vomiting.  Genitourinary: Negative for dysuria.  Musculoskeletal: Negative for joint swelling.  Skin: Negative for rash.  Allergic/Immunologic: Positive for environmental allergies. Negative for food allergies and immunocompromised state.  Neurological: Negative for headaches.  Hematological: Does not bruise/bleed easily.  Psychiatric/Behavioral: Negative for dysphoric mood. The patient is  nervous/anxious. nitourinary: Negative for dysuria.  Musculoskeletal: Negative for joint swelling.  Skin: Negative for rash.  Allergic/Immunologic: Positive for environmental allergies. Negative for food allergies and immunocompromised state.  Neurological: Negative for headaches.  Hematological: Does not bruise/bleed easily.  Psychiatric/Behavioral: Negative for dysphoric mood. The patient is nervous/anxious.    Past Medical History:  Diagnosis Date  . Asthma    as a child     Family History  Problem Relation Age of Onset  . Hypertension Mother   . Hyperlipidemia Mother   . Hypertension Father   . Cancer Father      Social History   Socioeconomic History  . Marital status: Married    Spouse name: Not on file  . Number of children: Not on file  . Years of education: Not on file  . Highest education level: Not on file  Occupational History  . Not on file  Tobacco Use  . Smoking status: Former Smoker    Packs/day: 1.00    Years: 5.00    Pack years: 5.00    Quit date: 07/15/2008    Years since quitting: 10.9  . Smokeless tobacco: Never Used  Substance and Sexual Activity  . Alcohol use: Yes    Comment: occ  . Drug use: No  . Sexual activity: Not on file  Other Topics Concern  . Not on file  Social History Narrative  . Not on file   Social Determinants of Health   Financial Resource Strain:   . Difficulty of Paying Living Expenses: Not on file  Food Insecurity:   . Worried About Programme researcher, broadcasting/film/video in the Last Year: Not on file  . Ran Out of Food in the Last Year: Not on file  Transportation Needs:   . Freight forwarder (Medical): Not on file  . Lack of Transportation (Non-Medical): Not on file  Physical Activity:   . Days of Exercise per Week: Not on file  . Minutes of Exercise per Session: Not on file  Stress:   . Feeling of Stress : Not on file  Social Connections:   . Frequency of Communication with Friends and Family: Not on file  . Frequency  of Social Gatherings with Friends and Family: Not on file  . Attends Religious Services: Not on file  . Active Member of Clubs or Organizations: Not on file  . Attends Banker Meetings: Not on file  . Marital Status: Not on file  Intimate Partner Violence:   . Fear of Current or Ex-Partner: Not on file  . Emotionally Abused: Not on file  . Physically Abused: Not on file  . Sexually Abused: Not on file     Allergies  Allergen Reactions  . Augmentin [Amoxicillin-Pot Clavulanate] Rash    Stomach pain, hand swelling, feet swelling, rash  . Propoxyphene Nausea Only  . Septra [Sulfamethoxazole-Trimethoprim]     Unknown reaction--childhood allergy     Outpatient Medications Prior to Visit  Medication Sig Dispense Refill  . albuterol (VENTOLIN HFA) 108 (90 Base) MCG/ACT inhaler Inhale 1 puff into the lungs every 6 (six) hours as needed for wheezing or shortness of breath.    Marland Kitchen DM-Phenylephrine-Acetaminophen (ALKA-SELTZER PLS SINUS & COUGH PO) Take 1 capsule by mouth 2 (two) times daily as needed (sinus congestion).    . fluticasone (VERAMYST) 27.5 MCG/SPRAY nasal spray Place 2 sprays into the nose daily.    Marland Kitchen guanFACINE (TENEX) 1 MG tablet Take 1 mg by mouth 2 (two) times a day.    . levothyroxine (SYNTHROID) 100 MCG tablet Take 1 tablet (100 mcg total) by mouth daily before breakfast. 30 tablet 3   No facility-administered medications prior to visit.         Objective:   Physical Exam Vitals:   06/07/19 0913  BP: 110/72  Pulse: 72  Temp: 98 F (36.7 C)  TempSrc: Temporal  SpO2: 97%  Weight: 168 lb (76.2 kg)  Height: 5\' 6"  (1.676 m)   Gen: Pleasant, well-nourished, in no distress,  normal affect  ENT: No lesions,  mouth clear,  oropharynx clear, no postnasal drip  Neck: No JVD, no stridor  Lungs: No use of accessory muscles, good excursion, no crackles or wheezing on normal respiration, no wheeze on forced expiration  Cardiovascular: RRR, heart sounds  normal, no murmur or gallops, no peripheral edema  Musculoskeletal: No deformities, no cyanosis or clubbing  Neuro: alert, awake, non focal  Skin: Warm, no lesions or rash     Assessment & Plan:  Dyspnea He carries a history of mild asthma but he is shortness of breath is characterized more by stuttering breathing, needing to sign order to get a deep breath in.  This is more reminiscent of a restrictive process.  He has had some ups and downs in his weight but at 168 pounds doubt that this is significant contributor to restriction.  He has a history of Hashimoto's thyroiditis, is at some risk for other autoimmune processes including possible myasthenia or MS although no other sequela of either of these diseases noted on history.  Believe he needs full PFT along with MIP, MEP to assess for obstruction, restriction, respiratory muscle weakness.  Further work-up depending on these results.  Chest x-ray today to compare  with prior.  Asthma We will obtain more data regarding his airflows with PFT.  Continue albuterol as needed for now.  Allergic rhinitis Persistent clear rhinitis with some associated clear sputum since an apparent viral syndrome in late December which appears to have cleared.  He does have seasonal allergies, just started fluticasone nasal spray last week.  I had like for him to continue this, and his seasonal loratadine now to see if he gets benefit.  Baltazar Apo, MD, PhD 06/07/2019, 1:07 PM [facility] [phone] or if no answer [phone]

## 2019-06-07 NOTE — Patient Instructions (Signed)
We will perform full pulmonary function testing including maximal inspiratory and expiratory pressures Chest x-ray today Please continue your fluticasone nasal spray, 2 sprays each nostril once daily. Go ahead and restart your loratadine 10 mg once daily Keep your albuterol available to use 2 puffs if needed for shortness of breath, chest tightness, wheezing. Follow with Dr Delton Coombes next available after your PFT are completed so that we can review together.

## 2019-06-07 NOTE — Assessment & Plan Note (Signed)
We will obtain more data regarding his airflows with PFT.  Continue albuterol as needed for now.

## 2019-06-13 ENCOUNTER — Inpatient Hospital Stay (HOSPITAL_COMMUNITY): Admission: RE | Admit: 2019-06-13 | Payer: BC Managed Care – PPO | Source: Ambulatory Visit

## 2019-06-17 DIAGNOSIS — F411 Generalized anxiety disorder: Secondary | ICD-10-CM | POA: Diagnosis not present

## 2019-06-17 DIAGNOSIS — F331 Major depressive disorder, recurrent, moderate: Secondary | ICD-10-CM | POA: Diagnosis not present

## 2019-06-24 DIAGNOSIS — F331 Major depressive disorder, recurrent, moderate: Secondary | ICD-10-CM | POA: Diagnosis not present

## 2019-06-24 DIAGNOSIS — F411 Generalized anxiety disorder: Secondary | ICD-10-CM | POA: Diagnosis not present

## 2019-07-01 DIAGNOSIS — F411 Generalized anxiety disorder: Secondary | ICD-10-CM | POA: Diagnosis not present

## 2019-07-01 DIAGNOSIS — F331 Major depressive disorder, recurrent, moderate: Secondary | ICD-10-CM | POA: Diagnosis not present

## 2019-07-04 ENCOUNTER — Ambulatory Visit (INDEPENDENT_AMBULATORY_CARE_PROVIDER_SITE_OTHER): Payer: BC Managed Care – PPO | Admitting: Primary Care

## 2019-07-04 ENCOUNTER — Encounter: Payer: Self-pay | Admitting: Primary Care

## 2019-07-04 ENCOUNTER — Other Ambulatory Visit: Payer: Self-pay

## 2019-07-04 DIAGNOSIS — R0602 Shortness of breath: Secondary | ICD-10-CM | POA: Diagnosis not present

## 2019-07-04 NOTE — Patient Instructions (Addendum)
  Dyspnea: - Residual shortness of breath since URI/Covid in Dec 2020. Hx childhood asthma.  - Symptoms consistent with restrictive process - CXR 06/07/19 showed clear lungs - Scheduled for full PFTs at [facility] tomorrow  - Started on BREO 100 one puff daily - Continue prn albuterol 2 puffs twice daily - Checking CBC with diff, IgE and D-dimer - Consider echocardiogram   Allergic rhinitis: - Continue loratadine and Flonase daily during allergy season   Follow Up Instructions:  - Follow up after PFT testing

## 2019-07-04 NOTE — Progress Notes (Signed)
Virtual Visit via Telephone Note  I connected with Sean Rojas on 07/04/19 at  9:00 AM EDT by telephone and verified that I am speaking with the correct person using two identifiers.  Location: Patient: Home Provider: Home   I discussed the limitations, risks, security and privacy concerns of performing an evaluation and management service by telephone and the availability of in person appointments. I also discussed with the patient that there may be a patient responsible charge related to this service. The patient expressed understanding and agreed to proceed.   History of Present Illness: 39 year old male, former minimal smoker (5 pack year hx). Hx childhood asthma, hashimoto's thyroiditis, hypothyroidism, right sided pleurisy. Patient of Dr. Delton Coombes seen for initial consult on 06/07/19 for asthma/ dyspnea and sinus congestion. Patient had URI symptoms in late Dec/early Jan and was later found to have COVID antibiodies. He has been recently treated for sinusitis. Symptoms more consistent with restrictive process. Needs PFTs along with NIP/MEPT to assess for obstruction/restriction and respiratory muscle weakness. Continue prn albuterol.   07/04/2019 Patient contacted today for televisit. He is scheduled tomorrow for PFTs. He first noted breathing difficulties started back in December 2019 which lasted for 3 weeks and went away. Then again in November 2020 he had URI symptoms and ended up with covid antibodies. His symptoms have lingered since then for the most part and significant noticeable about he had an allergic reaction to Augmentin in March of this year which caused hand and leg swelling. He struggles taking a deep breath and talking for a long period of time. He gets winded with regular activities. Feels his symptoms are likely d/t increase in pollen outside currently. He uses his albuterol about every 4 hours. He has been treated with prednisone, improves some for a short period. PCP started him  on BREO 100 last week. Denies weakness, numbness or tingling.    Observations/Objective:  - Appears well; able to speak in full sentences. No cough or wheeze  Assessment and Plan:  Dyspnea: - Residual shortness of breath since URI/Covid in Dec 2020. Hx childhood asthma.  - Symptoms consistent with restrictive process - CXR 06/07/19 showed clear lungs - Scheduled for full PFTs at [facility] tomorrow  - Started on BREO 100 one puff daily - Continue prn albuterol 2 puffs twice daily - Checking CBC with diff, IgE and D-dimer - Consider echocardiogram   Allergic rhinitis: - Continue loratadine and Flonase daily during allergy season   Follow Up Instructions:  - Follow up after PFT testing    I discussed the assessment and treatment plan with the patient. The patient was provided an opportunity to ask questions and all were answered. The patient agreed with the plan and demonstrated an understanding of the instructions.   The patient was advised to call back or seek an in-person evaluation if the symptoms worsen or if the condition fails to improve as anticipated.  I provided 18 minutes of non-face-to-face time during this encounter.   Glenford Bayley, NP

## 2019-07-05 ENCOUNTER — Ambulatory Visit (HOSPITAL_COMMUNITY)
Admission: RE | Admit: 2019-07-05 | Discharge: 2019-07-05 | Disposition: A | Discharge status: Home / Self Care | Payer: BC Managed Care – PPO | Source: Ambulatory Visit | Attending: Emergency Medicine | Admitting: Emergency Medicine

## 2019-07-05 ENCOUNTER — Other Ambulatory Visit (HOSPITAL_COMMUNITY)
Admission: RE | Admit: 2019-07-05 | Discharge: 2019-07-05 | Disposition: A | Discharge status: Home / Self Care | Payer: BC Managed Care – PPO | Source: Ambulatory Visit | Attending: Primary Care | Admitting: Primary Care

## 2019-07-05 ENCOUNTER — Other Ambulatory Visit: Payer: Self-pay

## 2019-07-05 ENCOUNTER — Telehealth: Payer: Self-pay | Admitting: Emergency Medicine

## 2019-07-05 DIAGNOSIS — R0602 Shortness of breath: Secondary | ICD-10-CM

## 2019-07-05 DIAGNOSIS — R0600 Dyspnea, unspecified: Secondary | ICD-10-CM | POA: Insufficient documentation

## 2019-07-05 LAB — PULMONARY FUNCTION TEST
DL/VA % pred: 95 %
DL/VA: 4.53 ml/min/mmHg/L
DLCO unc % pred: 83 %
DLCO unc: 23.11 ml/min/mmHg
FEF 25-75 Post: 3.41 L/sec
FEF 25-75 Pre: 3.13 L/sec
FEF2575-%Change-Post: 9 %
FEF2575-%Pred-Post: 93 %
FEF2575-%Pred-Pre: 85 %
FEV1-%Change-Post: 0 %
FEV1-%Pred-Post: 85 %
FEV1-%Pred-Pre: 84 %
FEV1-Post: 3.19 L
FEV1-Pre: 3.16 L
FEV1FVC-%Change-Post: 0 %
FEV1FVC-%Pred-Pre: 103 %
FEV6-%Change-Post: 1 %
FEV6-%Pred-Post: 84 %
FEV6-%Pred-Pre: 84 %
FEV6-Post: 3.87 L
FEV6-Pre: 3.83 L
FEV6FVC-%Pred-Post: 102 %
FEV6FVC-%Pred-Pre: 102 %
FVC-%Change-Post: 1 %
FVC-%Pred-Post: 82 %
FVC-%Pred-Pre: 82 %
FVC-Post: 3.87 L
FVC-Pre: 3.83 L
Post FEV1/FVC ratio: 83 %
Post FEV6/FVC ratio: 100 %
Pre FEV1/FVC ratio: 83 %
Pre FEV6/FVC Ratio: 100 %
RV % pred: 112 %
RV: 1.79 L
TLC % pred: 96 %
TLC: 5.91 L

## 2019-07-05 LAB — CBC WITH DIFFERENTIAL/PLATELET
Abs Immature Granulocytes: 0.01 10*3/uL (ref 0.00–0.07)
Basophils Absolute: 0 10*3/uL (ref 0.0–0.1)
Basophils Relative: 0 %
Eosinophils Absolute: 0.1 10*3/uL (ref 0.0–0.5)
Eosinophils Relative: 2 %
HCT: 44.1 % (ref 39.0–52.0)
Hemoglobin: 14.9 g/dL (ref 13.0–17.0)
Immature Granulocytes: 0 %
Lymphocytes Relative: 28 %
Lymphs Abs: 1.5 10*3/uL (ref 0.7–4.0)
MCH: 29.8 pg (ref 26.0–34.0)
MCHC: 33.8 g/dL (ref 30.0–36.0)
MCV: 88.2 fL (ref 80.0–100.0)
Monocytes Absolute: 0.4 10*3/uL (ref 0.1–1.0)
Monocytes Relative: 8 %
Neutro Abs: 3.3 10*3/uL (ref 1.7–7.7)
Neutrophils Relative %: 62 %
Platelets: 209 10*3/uL (ref 150–400)
RBC: 5 MIL/uL (ref 4.22–5.81)
RDW: 12.8 % (ref 11.5–15.5)
WBC: 5.3 10*3/uL (ref 4.0–10.5)
nRBC: 0 % (ref 0.0–0.2)

## 2019-07-05 LAB — D-DIMER, QUANTITATIVE: D-Dimer, Quant: <0.27 ug/mL-FEU (ref 0.00–0.50)

## 2019-07-05 MED ORDER — ALBUTEROL SULFATE (2.5 MG/3ML) 0.083% IN NEBU
2.5000 mg | INHALATION_SOLUTION | Freq: Once | RESPIRATORY_TRACT | Status: AC
  Administered 2019-07-05: 15:00:00 2.5 mg via RESPIRATORY_TRACT

## 2019-07-05 NOTE — Telephone Encounter (Signed)
ATC pt, no answer. Left message for pt to call back.  

## 2019-07-06 NOTE — Telephone Encounter (Signed)
His PFTs were normal. Labs were normal. All reassuring. Either residual from covid. May consider further cardiac testing. We can order echocardiogram or PCP can

## 2019-07-06 NOTE — Telephone Encounter (Signed)
lmtcb for pt.  

## 2019-07-06 NOTE — Telephone Encounter (Signed)
Spoke with pt. He is aware of Beth's response. Pt would like for [place] to order the echo. This has been done. Nothing further is needed.

## 2019-07-06 NOTE — Progress Notes (Signed)
Please let patient know labs were normal. Waiting for IgE

## 2019-07-06 NOTE — Telephone Encounter (Signed)
Called and spoke to pt. Pt states he has completed his PFT and lab work and requesting the results and recs from Buelah Manis, NP. Pt states he is hoping for some relief as he 'would really like to breathe again'. Pts SOB is unchanged since speaking with Beth on 4/5 during televisit and denies any new s/s. Pt also states the Breo that was started by his PCP was in fact the 200 strength and not the 100, med list has been updated.    Beth please advise. Thanks.

## 2019-07-07 LAB — IGE: IgE (Immunoglobulin E), Serum: 66 IU/mL (ref 6–495)

## 2019-07-07 NOTE — Progress Notes (Signed)
Patient identification verified. Results of recent lab work reviewed. Per Buelah Manis NP, CBC and D-Dimer studies were normal. We are still waiting for the results from the IgE. Patient verbalized understanding of results.

## 2019-07-07 NOTE — Progress Notes (Signed)
IgE normal

## 2019-07-11 ENCOUNTER — Telehealth: Payer: Self-pay

## 2019-07-11 NOTE — Telephone Encounter (Signed)
Faxed labs to [facility]

## 2019-07-11 NOTE — Progress Notes (Signed)
Patient identification verified. Recent lab reviewed. Per Buelah Manis NP, IgE was normal, patient verbalized understanding of results.

## 2019-07-13 DIAGNOSIS — E063 Autoimmune thyroiditis: Secondary | ICD-10-CM | POA: Diagnosis not present

## 2019-07-14 ENCOUNTER — Ambulatory Visit (HOSPITAL_COMMUNITY)
Admission: RE | Admit: 2019-07-14 | Discharge: 2019-07-14 | Disposition: A | Discharge status: Home / Self Care | Payer: BC Managed Care – PPO | Source: Ambulatory Visit | Attending: Primary Care | Admitting: Primary Care

## 2019-07-14 ENCOUNTER — Other Ambulatory Visit: Payer: Self-pay

## 2019-07-14 DIAGNOSIS — R0602 Shortness of breath: Secondary | ICD-10-CM | POA: Diagnosis not present

## 2019-07-14 NOTE — Progress Notes (Signed)
*  PRELIMINARY RESULTS* Echocardiogram 2D Echocardiogram has been performed.  Stacey Drain 07/14/2019, 9:11 AM

## 2019-07-15 DIAGNOSIS — F331 Major depressive disorder, recurrent, moderate: Secondary | ICD-10-CM | POA: Diagnosis not present

## 2019-07-15 DIAGNOSIS — F411 Generalized anxiety disorder: Secondary | ICD-10-CM | POA: Diagnosis not present

## 2019-07-15 NOTE — Progress Notes (Signed)
Please let patient know his echocardiogram was normal

## 2019-07-20 ENCOUNTER — Other Ambulatory Visit: Payer: Self-pay

## 2019-07-20 ENCOUNTER — Telehealth: Payer: Self-pay | Admitting: Primary Care

## 2019-07-20 ENCOUNTER — Telehealth: Payer: Self-pay | Admitting: *Deleted

## 2019-07-20 ENCOUNTER — Encounter: Payer: Self-pay | Admitting: Primary Care

## 2019-07-20 ENCOUNTER — Ambulatory Visit (INDEPENDENT_AMBULATORY_CARE_PROVIDER_SITE_OTHER): Payer: BC Managed Care – PPO | Admitting: Primary Care

## 2019-07-20 DIAGNOSIS — J849 Interstitial pulmonary disease, unspecified: Secondary | ICD-10-CM | POA: Diagnosis not present

## 2019-07-20 DIAGNOSIS — Z8616 Personal history of COVID-19: Secondary | ICD-10-CM

## 2019-07-20 DIAGNOSIS — R0602 Shortness of breath: Secondary | ICD-10-CM

## 2019-07-20 MED ORDER — ALBUTEROL SULFATE HFA 108 (90 BASE) MCG/ACT IN AERS: 1.0000 | INHALATION_SPRAY | Freq: Four times a day (QID) | RESPIRATORY_TRACT | 3 refills | Status: AC | PRN

## 2019-07-20 MED ORDER — MONTELUKAST SODIUM 10 MG PO TABS: 10.0000 mg | ORAL_TABLET | Freq: Every day | ORAL | 1 refills | Status: DC

## 2019-07-20 NOTE — Patient Instructions (Addendum)
Orders: HRCT re: covid/ ILD protocol   Rx: Singulair 10mg  at bed time  Albuterol 1-2 puffs into lungs every 4-6 hours for shortness of breath   Follow-up: Dr. in 2 months

## 2019-07-20 NOTE — Progress Notes (Signed)
Virtual Visit via Telephone Note  I connected with Sean Rojas on 07/20/19 at  9:00 AM EDT by telephone and verified that I am speaking with the correct person using two identifiers.  Location: Patient: Home Provider: Home   I discussed the limitations, risks, security and privacy concerns of performing an evaluation and management service by telephone and the availability of in person appointments. I also discussed with the patient that there may be a patient responsible charge related to this service. The patient expressed understanding and agreed to proceed.   History of Present Illness:  39 year old male, former minimal smoker (5 pack year hx). Hx childhood asthma, hashimoto's thyroiditis, hypothyroidism, right sided pleurisy. Patient of Dr. Delton Coombes seen for initial consult on 06/07/19 for asthma/ dyspnea and sinus congestion. Patient had URI symptoms in late Dec/early Jan and was later found to have COVID antibiodies. He has been recently treated for sinusitis. Symptoms more consistent with restrictive process. Needs PFTs along with NIP/MEPT to assess for obstruction/restriction and respiratory muscle weakness. Continue prn albuterol.   07/04/2019 Patient contacted today for televisit. He is scheduled tomorrow for PFTs. He first noted breathing difficulties started back in December 2019 which lasted for 3 weeks and went away. Then again in November 2020 he had URI symptoms and ended up with covid antibodies. His symptoms have lingered since then for the most part and significant noticeable about he had an allergic reaction to Augmentin in March of this year which caused hand and leg swelling. He struggles taking a deep breath and talking for a long period of time. He gets winded with regular activities. Feels his symptoms are likely d/t increase in pollen outside currently. He uses his albuterol about every 4 hours. He has been treated with prednisone, improves some for a short period. PCP started him  on BREO 100 last week. Denies weakness, numbness or tingling.     07/20/2019 Patient contacted today for follow-up televisit. He does not notice any improvement with Breo 200. He uses albuterol hfa approximately 2-4 times a day, states that it works for about an hour. Pollen does affect him when mowing. He gets winded when speaking and with minimal exertion. He is ok when exercising/working out but is short of breath after. Denies significant weakness.   Observations/Objective:  - Mild dyspnea when speaking  TESTING: 07/14/19 - Echocardiogram was normal; EF 55-60% unable to assess PA pressure.  07/05/19- PFTs showed no evidence of obstruction or restrictive lung disease. Normal diffusion capacity 07/05/19- Eosinophils and IgE were normal 07/05/19- D-dimer negative  3/21 CXR showed clear lungs   Assessment and Plan:  Dyspnea: - Patient had URI symptoms in late Dec/early Jan and was later found to have COVID antibodies - Pulmonary work up so far was been normal  - Checking HRCT r/o ILD - Consider low dose prednisone if any fibrosis changes on CT - Also can consider referrals to pulmonary rehab, cardiology and neuropsychiatry if there are associated mood changes   Follow Up Instructions:   Dr. Delton Coombes in 2 months  I discussed the assessment and treatment plan with the patient. The patient was provided an opportunity to ask questions and all were answered. The patient agreed with the plan and demonstrated an understanding of the instructions.   The patient was advised to call back or seek an in-person evaluation if the symptoms worsen or if the condition fails to improve as anticipated.  I provided 18 minutes of non-face-to-face time during this encounter.  Martyn Ehrich, NP

## 2019-07-20 NOTE — Telephone Encounter (Signed)
Thank you :)

## 2019-07-20 NOTE — Telephone Encounter (Signed)
Patient is still having shortness of breath with little exertion and speaking. No improvement with Breo. Work up has been unrevealing. Checking HRCT to ensure no ILD, may consider low dose prednisone. May consider referral to pulmonary rehab and cardiology. He had covid antibodies after URI in dec/Jan. Discussed with Dr. Isaiah Serge. FU with you in 2 months.

## 2019-07-22 DIAGNOSIS — F411 Generalized anxiety disorder: Secondary | ICD-10-CM | POA: Diagnosis not present

## 2019-07-22 DIAGNOSIS — R0602 Shortness of breath: Secondary | ICD-10-CM | POA: Diagnosis not present

## 2019-07-22 DIAGNOSIS — E063 Autoimmune thyroiditis: Secondary | ICD-10-CM | POA: Diagnosis not present

## 2019-07-22 DIAGNOSIS — F900 Attention-deficit hyperactivity disorder, predominantly inattentive type: Secondary | ICD-10-CM | POA: Diagnosis not present

## 2019-07-26 ENCOUNTER — Ambulatory Visit (HOSPITAL_COMMUNITY): Payer: BC Managed Care – PPO

## 2019-07-26 NOTE — Telephone Encounter (Signed)
Please advise 

## 2019-07-27 ENCOUNTER — Ambulatory Visit: Payer: BC Managed Care – PPO | Admitting: "Endocrinology

## 2019-07-29 DIAGNOSIS — S9031XA Contusion of right foot, initial encounter: Secondary | ICD-10-CM | POA: Diagnosis not present

## 2019-07-29 DIAGNOSIS — Z6826 Body mass index (BMI) 26.0-26.9, adult: Secondary | ICD-10-CM | POA: Diagnosis not present

## 2019-08-01 DIAGNOSIS — F331 Major depressive disorder, recurrent, moderate: Secondary | ICD-10-CM | POA: Diagnosis not present

## 2019-08-01 DIAGNOSIS — F411 Generalized anxiety disorder: Secondary | ICD-10-CM | POA: Diagnosis not present

## 2019-08-09 ENCOUNTER — Ambulatory Visit (HOSPITAL_COMMUNITY)
Admission: RE | Admit: 2019-08-09 | Discharge: 2019-08-09 | Disposition: A | Discharge status: Home / Self Care | Payer: BC Managed Care – PPO | Source: Ambulatory Visit | Attending: Primary Care | Admitting: Primary Care

## 2019-08-09 ENCOUNTER — Other Ambulatory Visit: Payer: Self-pay

## 2019-08-09 DIAGNOSIS — J849 Interstitial pulmonary disease, unspecified: Secondary | ICD-10-CM | POA: Diagnosis not present

## 2019-08-09 DIAGNOSIS — F17210 Nicotine dependence, cigarettes, uncomplicated: Secondary | ICD-10-CM | POA: Diagnosis not present

## 2019-08-09 DIAGNOSIS — R918 Other nonspecific abnormal finding of lung field: Secondary | ICD-10-CM | POA: Diagnosis not present

## 2019-08-10 NOTE — Progress Notes (Signed)
Please let patient know his HRCT  looked normal. It showed no evidence of pulmonary fibrosis, pleural effusion or pneumonia. Normal heart size. She had a very small lung nodule 45mm. Patient is considered low risk, former light smoker quit 2010. No follow up needed. Keep follow up with Dr. Delton Coombes.

## 2019-08-12 DIAGNOSIS — F331 Major depressive disorder, recurrent, moderate: Secondary | ICD-10-CM | POA: Diagnosis not present

## 2019-08-12 DIAGNOSIS — F411 Generalized anxiety disorder: Secondary | ICD-10-CM | POA: Diagnosis not present

## 2019-08-19 DIAGNOSIS — F331 Major depressive disorder, recurrent, moderate: Secondary | ICD-10-CM | POA: Diagnosis not present

## 2019-08-19 DIAGNOSIS — F411 Generalized anxiety disorder: Secondary | ICD-10-CM | POA: Diagnosis not present

## 2019-08-24 ENCOUNTER — Ambulatory Visit: Payer: BC Managed Care – PPO | Admitting: "Endocrinology

## 2019-08-26 DIAGNOSIS — F411 Generalized anxiety disorder: Secondary | ICD-10-CM | POA: Diagnosis not present

## 2019-08-26 DIAGNOSIS — F331 Major depressive disorder, recurrent, moderate: Secondary | ICD-10-CM | POA: Diagnosis not present

## 2019-08-30 ENCOUNTER — Other Ambulatory Visit: Payer: Self-pay

## 2019-08-30 ENCOUNTER — Ambulatory Visit (INDEPENDENT_AMBULATORY_CARE_PROVIDER_SITE_OTHER): Payer: BC Managed Care – PPO | Admitting: "Endocrinology

## 2019-08-30 ENCOUNTER — Encounter: Payer: Self-pay | Admitting: "Endocrinology

## 2019-08-30 VITALS — BP 118/80 | HR 73 | Ht 66.0 in | Wt 169.8 lb

## 2019-08-30 DIAGNOSIS — E038 Other specified hypothyroidism: Secondary | ICD-10-CM

## 2019-08-30 DIAGNOSIS — E063 Autoimmune thyroiditis: Secondary | ICD-10-CM | POA: Diagnosis not present

## 2019-08-30 MED ORDER — SYNTHROID 112 MCG PO TABS: 112.0000 ug | ORAL_TABLET | Freq: Every day | ORAL | 1 refills | Status: DC

## 2019-08-30 NOTE — Progress Notes (Signed)
08/30/2019, 11:39 AM      Endocrinology follow-up note   Subjective:    Patient ID: Sean Rojas, male    DOB: 1980/08/20, PCP Practice, [facility]   Past Medical History:  Diagnosis Date  . Asthma    as a child   Past Surgical History:  Procedure Laterality Date  . HERNIA REPAIR Left   . KNEE ARTHROPLASTY Right    Patella release and condoplasty  . MASS EXCISION Left 07/20/2015   Procedure: EXCISION SOFT TISSUE MASS 3 CM;  Surgeon: Franky Macho, MD;  Location: [facility];  Service: General;  Laterality: Left;  Marland Kitchen VASECTOMY     Social History   Socioeconomic History  . Marital status: Married    Spouse name: Not on file  . Number of children: Not on file  . Years of education: Not on file  . Highest education level: Not on file  Occupational History  . Not on file  Tobacco Use  . Smoking status: Former Smoker    Packs/day: 1.00    Years: 5.00    Pack years: 5.00    Quit date: 07/15/2008    Years since quitting: 11.1  . Smokeless tobacco: Never Used  Substance and Sexual Activity  . Alcohol use: Yes    Comment: occ  . Drug use: No  . Sexual activity: Not on file  Other Topics Concern  . Not on file  Social History Narrative  . Not on file   Social Determinants of Health   Financial Resource Strain:   . Difficulty of Paying Living Expenses:   Food Insecurity:   . Worried About Programme researcher, broadcasting/film/video in the Last Year:   . Barista in the Last Year:   Transportation Needs:   . Freight forwarder (Medical):   Marland Kitchen Lack of Transportation (Non-Medical):   Physical Activity:   . Days of Exercise per Week:   . Minutes of Exercise per Session:   Stress:   . Feeling of Stress :   Social Connections:   . Frequency of Communication with Friends and Family:   . Frequency of Social Gatherings with Friends and Family:   . Attends Religious Services:   . Active Member of Clubs or Organizations:   . Attends  Banker Meetings:   Marland Kitchen Marital Status:    Family History  Problem Relation Age of Onset  . Hypertension Mother   . Hyperlipidemia Mother   . Hypertension Father   . Cancer Father    Outpatient Encounter Medications as of 08/30/2019  Medication Sig  . albuterol (VENTOLIN HFA) 108 (90 Base) MCG/ACT inhaler Inhale 1 puff into the lungs every 6 (six) hours as needed for wheezing or shortness of breath.  Marland Kitchen BREO ELLIPTA 200-25 MCG/INH AEPB Inhale 1 puff into the lungs daily.  Marland Kitchen guanFACINE (TENEX) 1 MG tablet Take 1 mg by mouth daily.   Marland Kitchen SYNTHROID 112 MCG tablet Take 1 tablet (112 mcg total) by mouth daily before breakfast.  . [DISCONTINUED] DM-Phenylephrine-Acetaminophen (ALKA-SELTZER PLS SINUS & COUGH PO) Take 1 capsule by mouth 2 (two) times daily as needed (sinus congestion).  . [DISCONTINUED] fluticasone (VERAMYST) 27.5 MCG/SPRAY nasal spray Place 2 sprays into  the nose daily.  . [DISCONTINUED] levothyroxine (SYNTHROID) 100 MCG tablet Take 1 tablet (100 mcg total) by mouth daily before breakfast. (Patient not taking: Reported on 07/20/2019)  . [DISCONTINUED] levothyroxine (SYNTHROID) 100 MCG tablet Take 100 mcg by mouth daily before breakfast.  . [DISCONTINUED] montelukast (SINGULAIR) 10 MG tablet Take 1 tablet (10 mg total) by mouth at bedtime.   No facility-administered encounter medications on file as of 08/30/2019.   ALLERGIES: Allergies  Allergen Reactions  . Augmentin [Amoxicillin-Pot Clavulanate] Rash    Stomach pain, hand swelling, feet swelling, rash  . Propoxyphene Nausea Only  . Septra [Sulfamethoxazole-Trimethoprim]     Unknown reaction--childhood allergy    VACCINATION STATUS: Immunization History  Administered Date(s) Administered  . Influenza,inj,Quad PF,6-35 Mos 01/30/2019    HPI Sean Rojas is 39 y.o. male who presents today with a medical history as above. he is being seen in follow-up for hypothyroidism associated with Hashimoto's thyroiditis.  -  He  was initiated on levothyroxine last year which was adjusted and switched to Synthroid at 100 mcg after his last visit.  Levothyroxine reportedly closed cough as a side effect. -  He is tolerating Synthroid better.  He has no new complaints today.  He was evaluated with full work-up by pulmonologist as a work-up for his cough with no significant findings.  His previsit labs show evidence of slight under replacement.    He has unidentified thyroid dysfunction in 1 of his grandparents.  He denies dysphagia, shortness of breath, nor voice change.  His thyroid/neck ultrasound in February 2021 was negative for discrete nodules. -He denies palpitation, tremors, nor heat/cold intolerance.   Review of Systems Limited as above.   Objective:    BP 118/80   Pulse 73   Ht 5\' 6"  (1.676 m)   Wt 169 lb 12.8 oz (77 kg)   BMI 27.41 kg/m   Wt Readings from Last 3 Encounters:  08/30/19 169 lb 12.8 oz (77 kg)  06/07/19 168 lb (76.2 kg)  09/07/18 163 lb (73.9 kg)     CMP     Component Value Date/Time   NA 142 07/16/2015 1000   K 4.8 07/16/2015 1000   CL 105 07/16/2015 1000   CO2 29 07/16/2015 1000   GLUCOSE 96 07/16/2015 1000   BUN 12 07/16/2015 1000   CREATININE 0.86 07/16/2015 1000   CALCIUM 9.3 07/16/2015 1000   GFRNONAA >60 07/16/2015 1000   GFRAA >60 07/16/2015 1000   Recent Results (from the past 2160 hour(s))  Pulmonary function test     Status: None   Collection Time: 07/05/19  2:47 PM  Result Value Ref Range   FVC-Pre 3.83 L   FVC-%Pred-Pre 82 %   FVC-Post 3.87 L   FVC-%Pred-Post 82 %   FVC-%Change-Post 1 %   FEV1-Pre 3.16 L   FEV1-%Pred-Pre 84 %   FEV1-Post 3.19 L   FEV1-%Pred-Post 85 %   FEV1-%Change-Post 0 %   FEV6-Pre 3.83 L   FEV6-%Pred-Pre 84 %   FEV6-Post 3.87 L   FEV6-%Pred-Post 84 %   FEV6-%Change-Post 1 %   Pre FEV1/FVC ratio 83 %   FEV1FVC-%Pred-Pre 103 %   Post FEV1/FVC ratio 83 %   FEV1FVC-%Change-Post 0 %   Pre FEV6/FVC Ratio 100 %   FEV6FVC-%Pred-Pre  102 %   Post FEV6/FVC ratio 100 %   FEV6FVC-%Pred-Post 102 %   FEF 25-75 Pre 3.13 L/sec   FEF2575-%Pred-Pre 85 %   FEF 25-75 Post 3.41 L/sec   FEF2575-%Pred-Post 93 %  FEF2575-%Change-Post 9 %   RV 1.79 L   RV % pred 112 %   TLC 5.91 L   TLC % pred 96 %   DLCO unc 23.11 ml/min/mmHg   DLCO unc % pred 83 %   DL/VA 1.95 ml/min/mmHg/L   DL/VA % pred 95 %  D-Dimer, Quantitative     Status: None   Collection Time: 07/05/19  3:57 PM  Result Value Ref Range   D-Dimer, Quant <0.27 0.00 - 0.50 ug/mL-FEU    Comment: (NOTE) At the manufacturer cut-off of 0.50 ug/mL FEU, this assay has been documented to exclude PE with a sensitivity and negative predictive value of 97 to 99%.  At this time, this assay has not been approved by the FDA to exclude DVT/VTE. Results should be correlated with clinical presentation. Performed at [facility], [address], [place], [place] [address]   IgE     Status: None   Collection Time: 07/05/19  3:57 PM  Result Value Ref Range   IgE (Immunoglobulin E), Serum 66 6 - 495 IU/mL    Comment: (NOTE) Performed At: [facility] [address] [place], [place] [id number] Jolene Schimke MD IP:[id number]   CBC with Differential/Platelet     Status: None   Collection Time: 07/05/19  3:57 PM  Result Value Ref Range   WBC 5.3 4.0 - 10.5 K/uL   RBC 5.00 4.22 - 5.81 MIL/uL   Hemoglobin 14.9 13.0 - 17.0 g/dL   HCT 73.4 19.3 - 79.0 %   MCV 88.2 80.0 - 100.0 fL   MCH 29.8 26.0 - 34.0 pg   MCHC 33.8 30.0 - 36.0 g/dL   RDW 24.0 97.3 - 53.2 %   Platelets 209 150 - 400 K/uL   nRBC 0.0 0.0 - 0.2 %   Neutrophils Relative % 62 %   Neutro Abs 3.3 1.7 - 7.7 K/uL   Lymphocytes Relative 28 %   Lymphs Abs 1.5 0.7 - 4.0 K/uL   Monocytes Relative 8 %   Monocytes Absolute 0.4 0.1 - 1.0 K/uL   Eosinophils Relative 2 %   Eosinophils Absolute 0.1 0.0 - 0.5 K/uL   Basophils Relative 0 %   Basophils Absolute 0.0 0.0 - 0.1 K/uL   Immature Granulocytes 0 %   Abs  Immature Granulocytes 0.01 0.00 - 0.07 K/uL    Comment: Performed at [facility], [address] [place], [place] [address]    Assessment & Plan:   1.  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  He has hypothyroidism due to Hashimoto's thyroiditis.  He will benefit from slight increase in his Synthroid.  I discussed and prescribed Synthroid 112 mcg p.o. daily before breakfast.    - We discussed about the correct intake of his thyroid hormone, on empty stomach at fasting, with water, separated by at least 30 minutes from breakfast and other medications,  and separated by more than 4 hours from calcium, iron, multivitamins, acid reflux medications (PPIs). -Patient is made aware of the fact that thyroid hormone replacement is needed for life, dose to be adjusted by periodic monitoring of thyroid function tests.  I discussed his thyroid/neck ultrasound which did not show any nodules hence no need for intervention.  - I advised him  to maintain close follow up with Practice, [facility] for primary care needs.     - Time spent on this patient care encounter:  20 minutes of which 50%  was spent in  counseling and the rest reviewing  his current and  previous labs / studies and medications  doses and developing a plan for long term care. [name]  participated in the discussions, expressed understanding, and voiced agreement with the above plans.  All questions were answered to his satisfaction. he is encouraged to contact clinic should he have any questions or concerns prior to his return visit.   Follow up plan: Return for F/U with Pre-visit Labs.   Glade Lloyd, MD [facility] [address] [place], [place] [address] Phone: [phone]  Fax: [phone]     08/30/2019, 11:39 AM  This note was partially dictated with voice recognition software.  Similar sounding words can be transcribed inadequately or may not  be corrected upon review.

## 2019-09-02 DIAGNOSIS — F331 Major depressive disorder, recurrent, moderate: Secondary | ICD-10-CM | POA: Diagnosis not present

## 2019-09-02 DIAGNOSIS — F411 Generalized anxiety disorder: Secondary | ICD-10-CM | POA: Diagnosis not present

## 2019-09-05 DIAGNOSIS — J019 Acute sinusitis, unspecified: Secondary | ICD-10-CM | POA: Diagnosis not present

## 2019-09-05 DIAGNOSIS — J209 Acute bronchitis, unspecified: Secondary | ICD-10-CM | POA: Diagnosis not present

## 2019-09-09 DIAGNOSIS — F331 Major depressive disorder, recurrent, moderate: Secondary | ICD-10-CM | POA: Diagnosis not present

## 2019-09-09 DIAGNOSIS — F411 Generalized anxiety disorder: Secondary | ICD-10-CM | POA: Diagnosis not present

## 2019-09-19 ENCOUNTER — Other Ambulatory Visit: Payer: Self-pay | Admitting: "Endocrinology

## 2019-09-19 ENCOUNTER — Other Ambulatory Visit: Payer: Self-pay

## 2019-09-19 DIAGNOSIS — E063 Autoimmune thyroiditis: Secondary | ICD-10-CM

## 2019-09-19 MED ORDER — SYNTHROID 125 MCG PO TABS: 125.0000 ug | ORAL_TABLET | Freq: Every day | ORAL | 0 refills | Status: AC

## 2019-09-23 DIAGNOSIS — S335XXA Sprain of ligaments of lumbar spine, initial encounter: Secondary | ICD-10-CM | POA: Diagnosis not present

## 2019-09-23 DIAGNOSIS — M546 Pain in thoracic spine: Secondary | ICD-10-CM | POA: Diagnosis not present

## 2019-09-23 DIAGNOSIS — F331 Major depressive disorder, recurrent, moderate: Secondary | ICD-10-CM | POA: Diagnosis not present

## 2019-09-23 DIAGNOSIS — S134XXA Sprain of ligaments of cervical spine, initial encounter: Secondary | ICD-10-CM | POA: Diagnosis not present

## 2019-09-23 DIAGNOSIS — F411 Generalized anxiety disorder: Secondary | ICD-10-CM | POA: Diagnosis not present

## 2019-09-29 DIAGNOSIS — M546 Pain in thoracic spine: Secondary | ICD-10-CM | POA: Diagnosis not present

## 2019-09-29 DIAGNOSIS — S134XXA Sprain of ligaments of cervical spine, initial encounter: Secondary | ICD-10-CM | POA: Diagnosis not present

## 2019-09-29 DIAGNOSIS — S335XXA Sprain of ligaments of lumbar spine, initial encounter: Secondary | ICD-10-CM | POA: Diagnosis not present

## 2019-10-10 ENCOUNTER — Telehealth: Payer: Self-pay | Admitting: "Endocrinology

## 2019-10-10 NOTE — Telephone Encounter (Signed)
Patient called and wanted his lab orders faxed to [facility] so that he can get his labs done as he is due for a refill of synthroid and Dr. Fransico Him will not refill the medication until after receiving lab results. [place] faxed lab orders.

## 2019-10-11 DIAGNOSIS — E063 Autoimmune thyroiditis: Secondary | ICD-10-CM | POA: Diagnosis not present

## 2019-10-11 DIAGNOSIS — S335XXA Sprain of ligaments of lumbar spine, initial encounter: Secondary | ICD-10-CM | POA: Diagnosis not present

## 2019-10-11 DIAGNOSIS — S134XXA Sprain of ligaments of cervical spine, initial encounter: Secondary | ICD-10-CM | POA: Diagnosis not present

## 2019-10-11 DIAGNOSIS — E038 Other specified hypothyroidism: Secondary | ICD-10-CM | POA: Diagnosis not present

## 2019-10-11 DIAGNOSIS — M546 Pain in thoracic spine: Secondary | ICD-10-CM | POA: Diagnosis not present

## 2019-10-12 LAB — TSH: TSH: 0.09 — AB (ref 0.41–5.90)

## 2019-10-13 ENCOUNTER — Telehealth: Payer: Self-pay

## 2019-10-13 NOTE — Telephone Encounter (Signed)
Discussed with pt that his lab work now shows his thyroid is overactive and he needs to lower his dose of synthroid to per Dr.Nida, understanding voiced. Pt made aware he will need to have labs rechecked in eight weeks per Dr.Nida

## 2019-10-13 NOTE — Telephone Encounter (Signed)
Left a message requesting pt return a call to the office. 

## 2019-10-14 DIAGNOSIS — F331 Major depressive disorder, recurrent, moderate: Secondary | ICD-10-CM | POA: Diagnosis not present

## 2019-10-14 DIAGNOSIS — F411 Generalized anxiety disorder: Secondary | ICD-10-CM | POA: Diagnosis not present

## 2019-10-21 DIAGNOSIS — F411 Generalized anxiety disorder: Secondary | ICD-10-CM | POA: Diagnosis not present

## 2019-10-21 DIAGNOSIS — F331 Major depressive disorder, recurrent, moderate: Secondary | ICD-10-CM | POA: Diagnosis not present

## 2019-10-27 DIAGNOSIS — S134XXA Sprain of ligaments of cervical spine, initial encounter: Secondary | ICD-10-CM | POA: Diagnosis not present

## 2019-10-27 DIAGNOSIS — S335XXA Sprain of ligaments of lumbar spine, initial encounter: Secondary | ICD-10-CM | POA: Diagnosis not present

## 2019-10-27 DIAGNOSIS — S233XXA Sprain of ligaments of thoracic spine, initial encounter: Secondary | ICD-10-CM | POA: Diagnosis not present

## 2019-11-17 DIAGNOSIS — S233XXA Sprain of ligaments of thoracic spine, initial encounter: Secondary | ICD-10-CM | POA: Diagnosis not present

## 2019-11-17 DIAGNOSIS — S338XXA Sprain of other parts of lumbar spine and pelvis, initial encounter: Secondary | ICD-10-CM | POA: Diagnosis not present

## 2019-11-17 DIAGNOSIS — S134XXA Sprain of ligaments of cervical spine, initial encounter: Secondary | ICD-10-CM | POA: Diagnosis not present

## 2019-11-23 DIAGNOSIS — F840 Autistic disorder: Secondary | ICD-10-CM | POA: Diagnosis not present

## 2020-01-05 DIAGNOSIS — E063 Autoimmune thyroiditis: Secondary | ICD-10-CM | POA: Diagnosis not present

## 2020-01-05 DIAGNOSIS — R5382 Chronic fatigue, unspecified: Secondary | ICD-10-CM | POA: Diagnosis not present

## 2020-01-05 LAB — TSH: TSH: 0.05 — AB (ref 0.41–5.90)

## 2020-01-23 DIAGNOSIS — F411 Generalized anxiety disorder: Secondary | ICD-10-CM | POA: Diagnosis not present

## 2020-01-23 DIAGNOSIS — F840 Autistic disorder: Secondary | ICD-10-CM | POA: Diagnosis not present

## 2020-02-06 DIAGNOSIS — F840 Autistic disorder: Secondary | ICD-10-CM | POA: Diagnosis not present

## 2020-02-06 DIAGNOSIS — F411 Generalized anxiety disorder: Secondary | ICD-10-CM | POA: Diagnosis not present

## 2020-02-20 DIAGNOSIS — F411 Generalized anxiety disorder: Secondary | ICD-10-CM | POA: Diagnosis not present

## 2020-02-20 DIAGNOSIS — F840 Autistic disorder: Secondary | ICD-10-CM | POA: Diagnosis not present

## 2020-02-29 ENCOUNTER — Ambulatory Visit: Payer: BC Managed Care – PPO | Admitting: "Endocrinology

## 2020-02-29 ENCOUNTER — Telehealth: Payer: Self-pay

## 2020-02-29 NOTE — Telephone Encounter (Signed)
Called patient yesterday to remind him of appt-he said he would not be coming back as a patient.

## 2020-03-05 DIAGNOSIS — F840 Autistic disorder: Secondary | ICD-10-CM | POA: Diagnosis not present

## 2020-03-05 DIAGNOSIS — F411 Generalized anxiety disorder: Secondary | ICD-10-CM | POA: Diagnosis not present

## 2020-03-06 IMAGING — US US THYROID
1 series · 14 of 25 positions shown · non-contrast
Comparison: None.

CLINICAL DATA: Hypothyroid. 38-year-old male with Hashimoto's
thyroiditis and hypothyroidism on thyroid hormone replacement
therapy.

EXAM:
THYROID ULTRASOUND
TECHNIQUE: Ultrasound examination of the thyroid gland and adjacent soft
tissues was performed.

[Series 1: us thyroid · 14 of 43 slices shown]
[im 1/43]
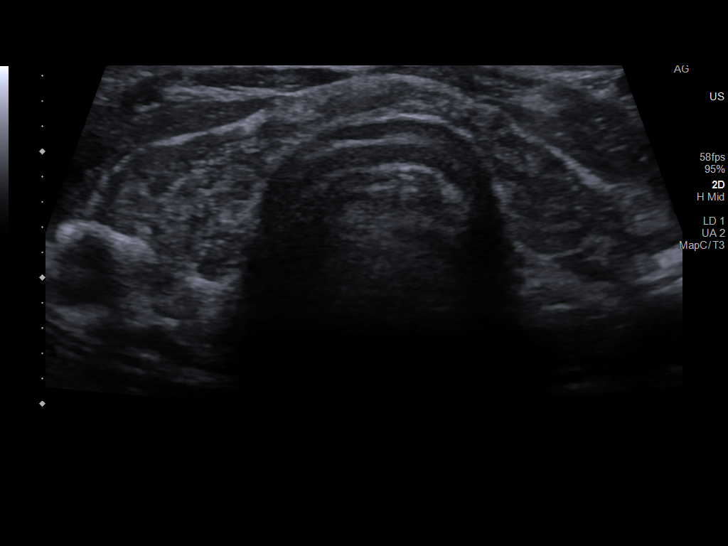
[im 4/43]
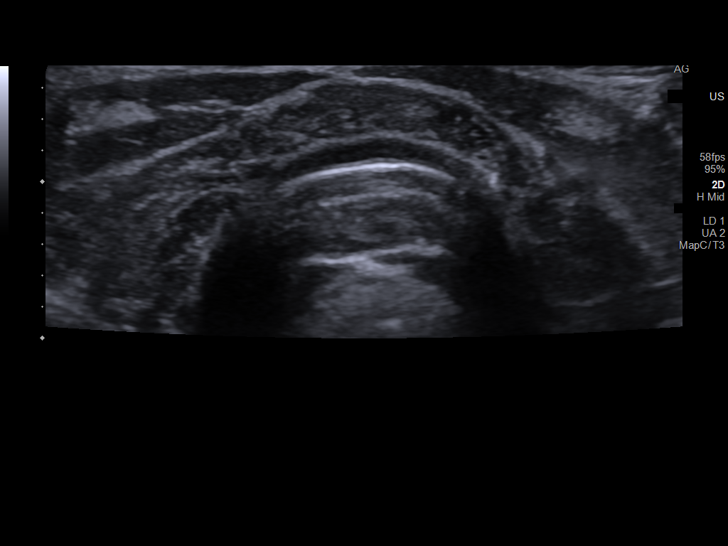
[im 8/43]
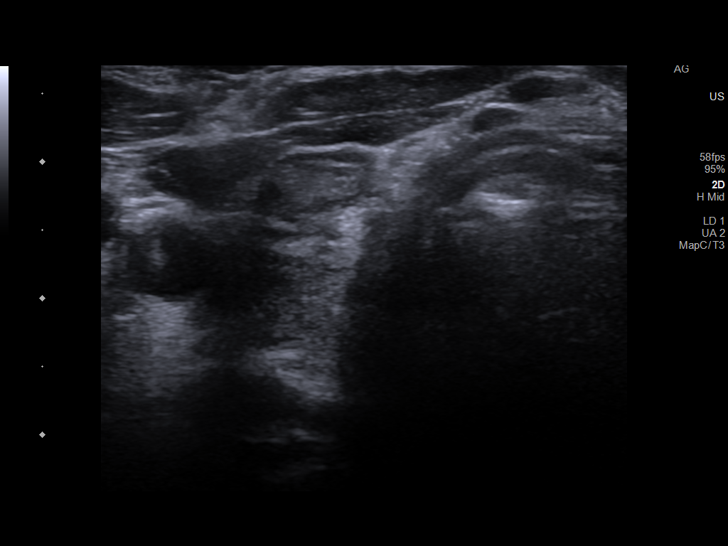
[im 11/43]
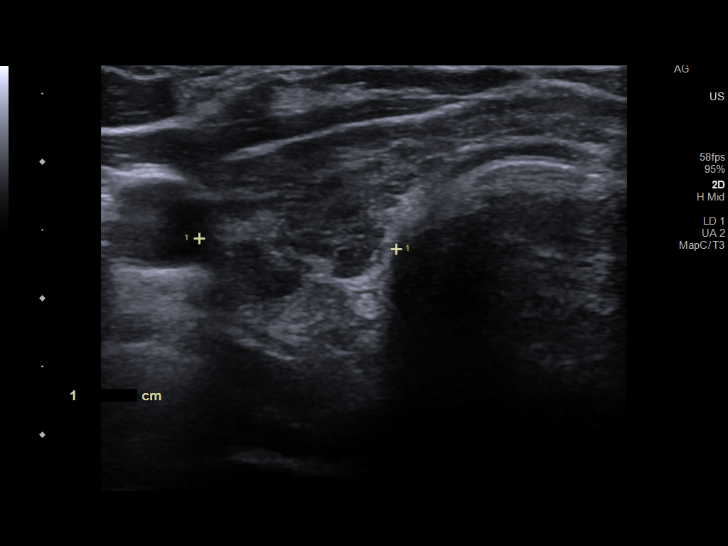
[im 15/43]
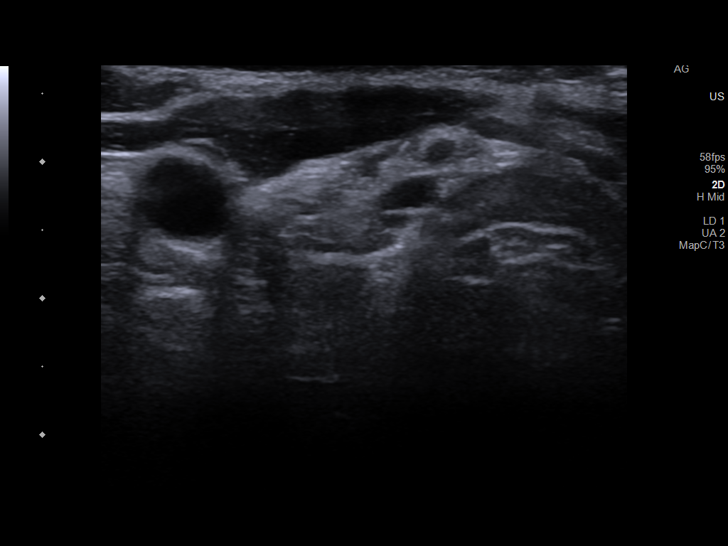
[im 16/43]
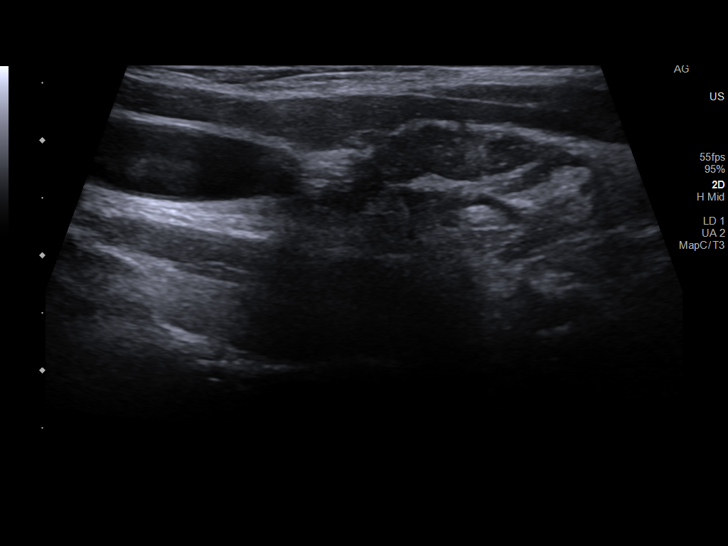
[im 20/43]
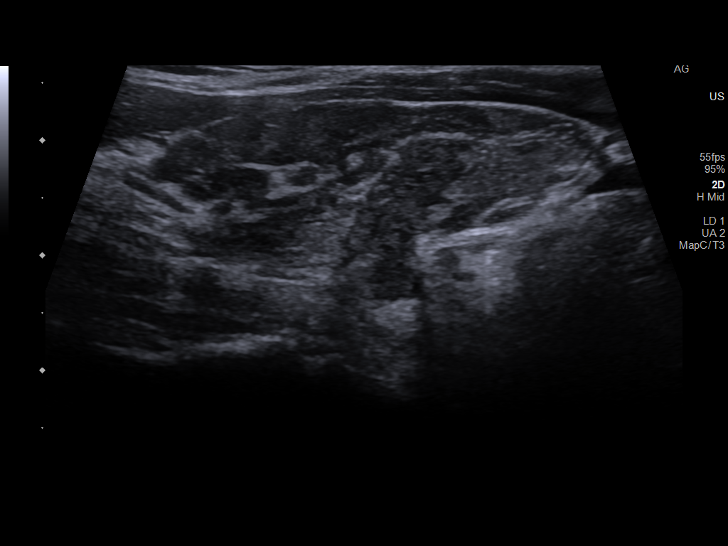
[im 23/43]
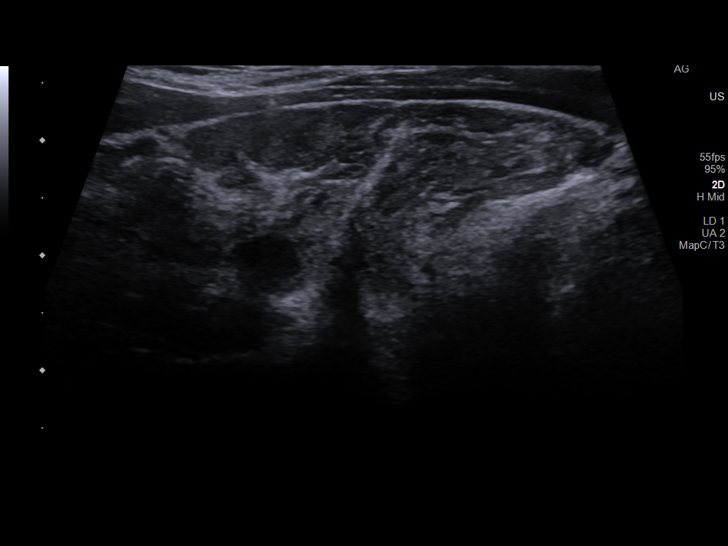
[im 27/43]
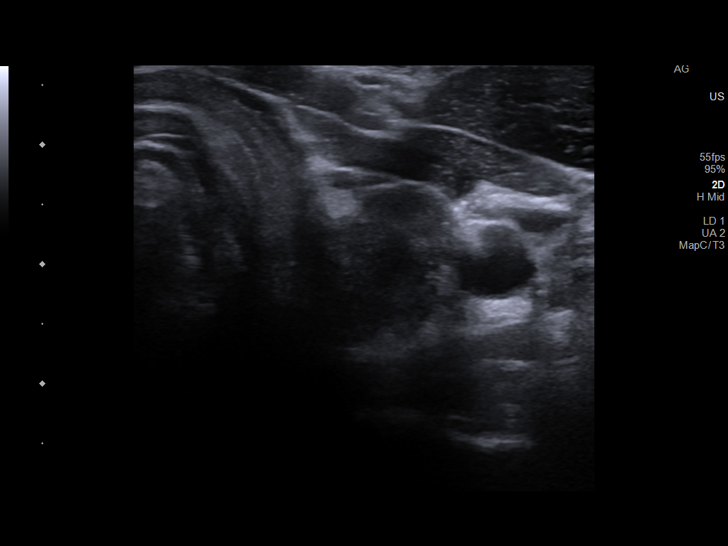
[im 29/43]
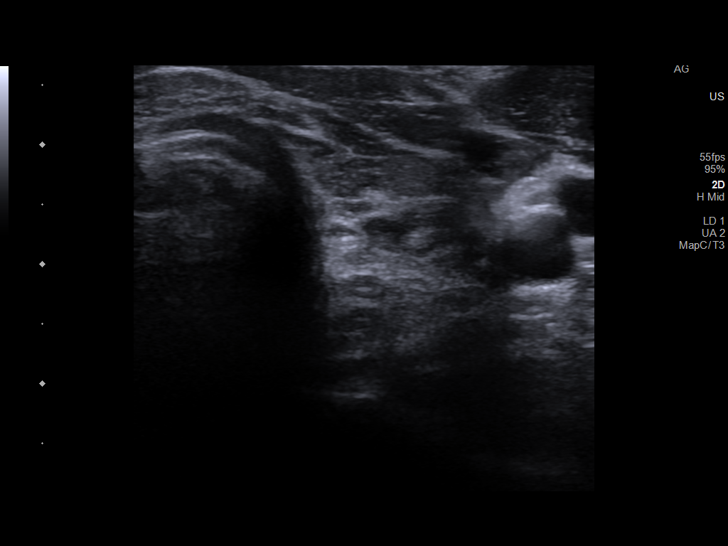
[im 32/43]
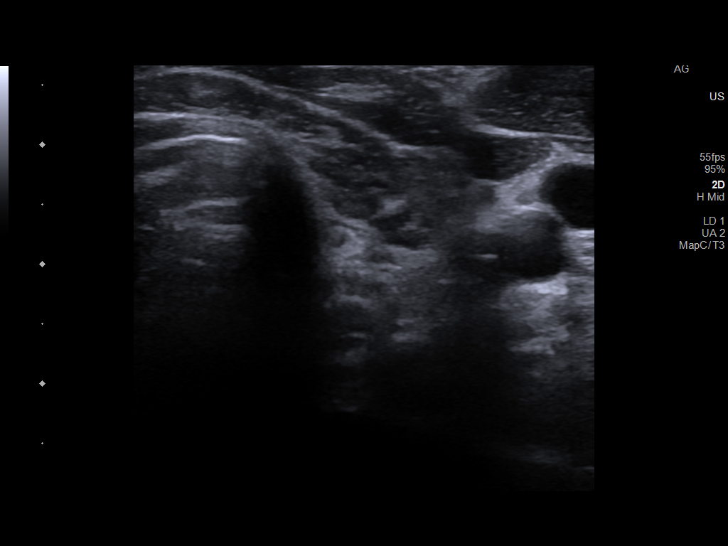
[im 36/43]
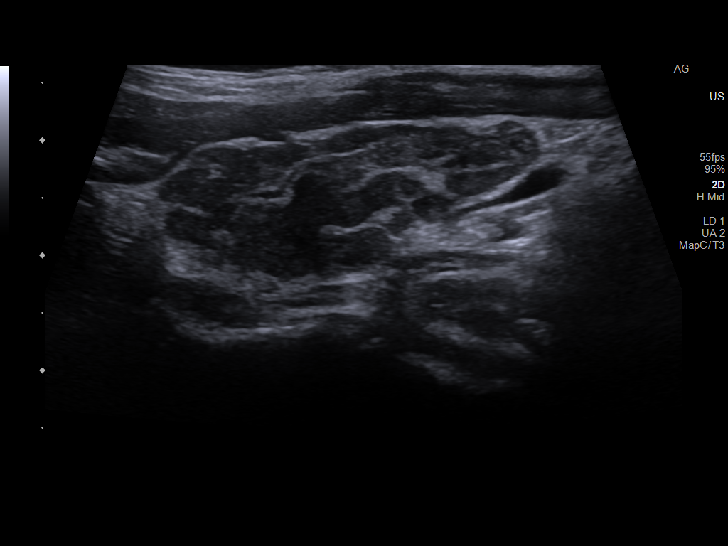
[im 39/43]
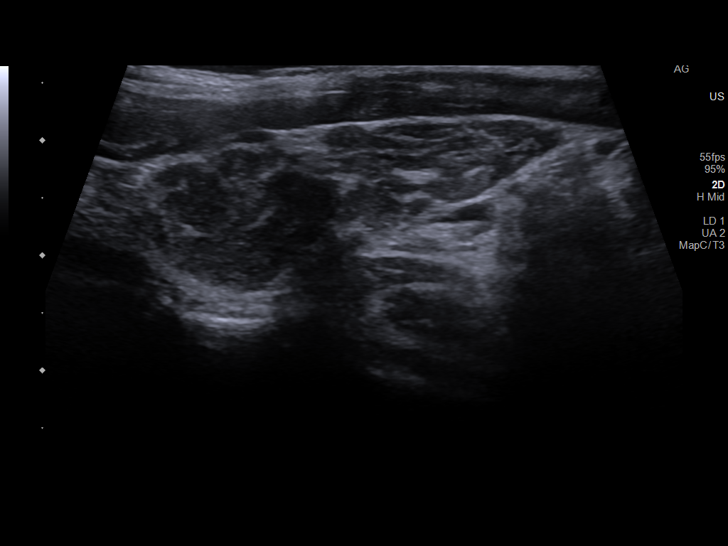
[im 43/43]
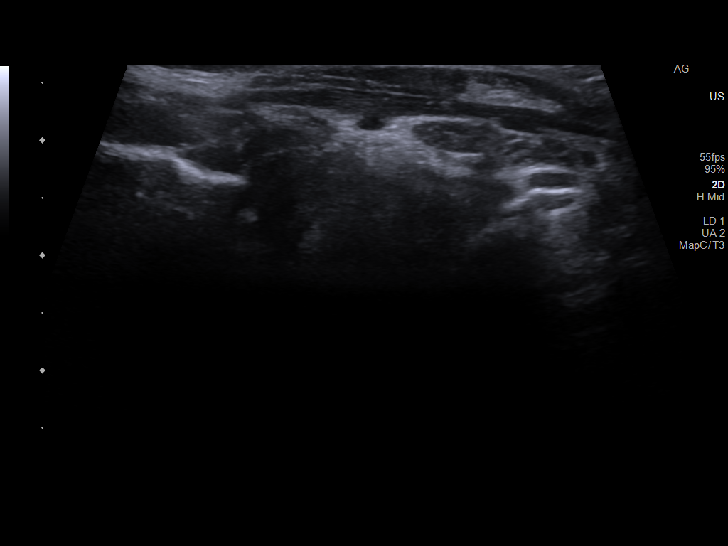

[14 of 25 positions shown; findings below may reference images not displayed]

FINDINGS: Parenchymal Echotexture: Markedly heterogenous

Isthmus: 0.4 cm

Right lobe: 3.9 x 1.8 x 1.4 cm

Left lobe: 3.7 x 1.5 x 1.5 cm

_________________________________________________________

Estimated total number of nodules >/= 1 cm: 0

Number of spongiform nodules >/=  2 cm not described below (TR1): 0

Number of mixed cystic and solid nodules >/= 1.5 cm not described
below (TR2): 0

_________________________________________________________

Diffusely heterogeneous and largely hypoechoic thyroid gland. There
are echogenic septum running throughout the thyroid parenchyma. No
discrete thyroid nodules are evident.
IMPRESSION: Diffusely heterogeneous and striated thyroid gland. The imaging
appearance is consistent with the clinical history of chronic
Hashimoto's thyroiditis.

No discrete thyroid nodules are identified.

## 2020-03-19 DIAGNOSIS — F840 Autistic disorder: Secondary | ICD-10-CM | POA: Diagnosis not present

## 2020-03-19 DIAGNOSIS — F411 Generalized anxiety disorder: Secondary | ICD-10-CM | POA: Diagnosis not present

## 2020-04-09 IMAGING — DX DG CHEST 2V
2 series · 2 of 2 positions shown · non-contrast
Comparison: April 20, 2019

CLINICAL DATA: Shortness of breath

EXAM:
CHEST - 2 VIEW

[chest pa]
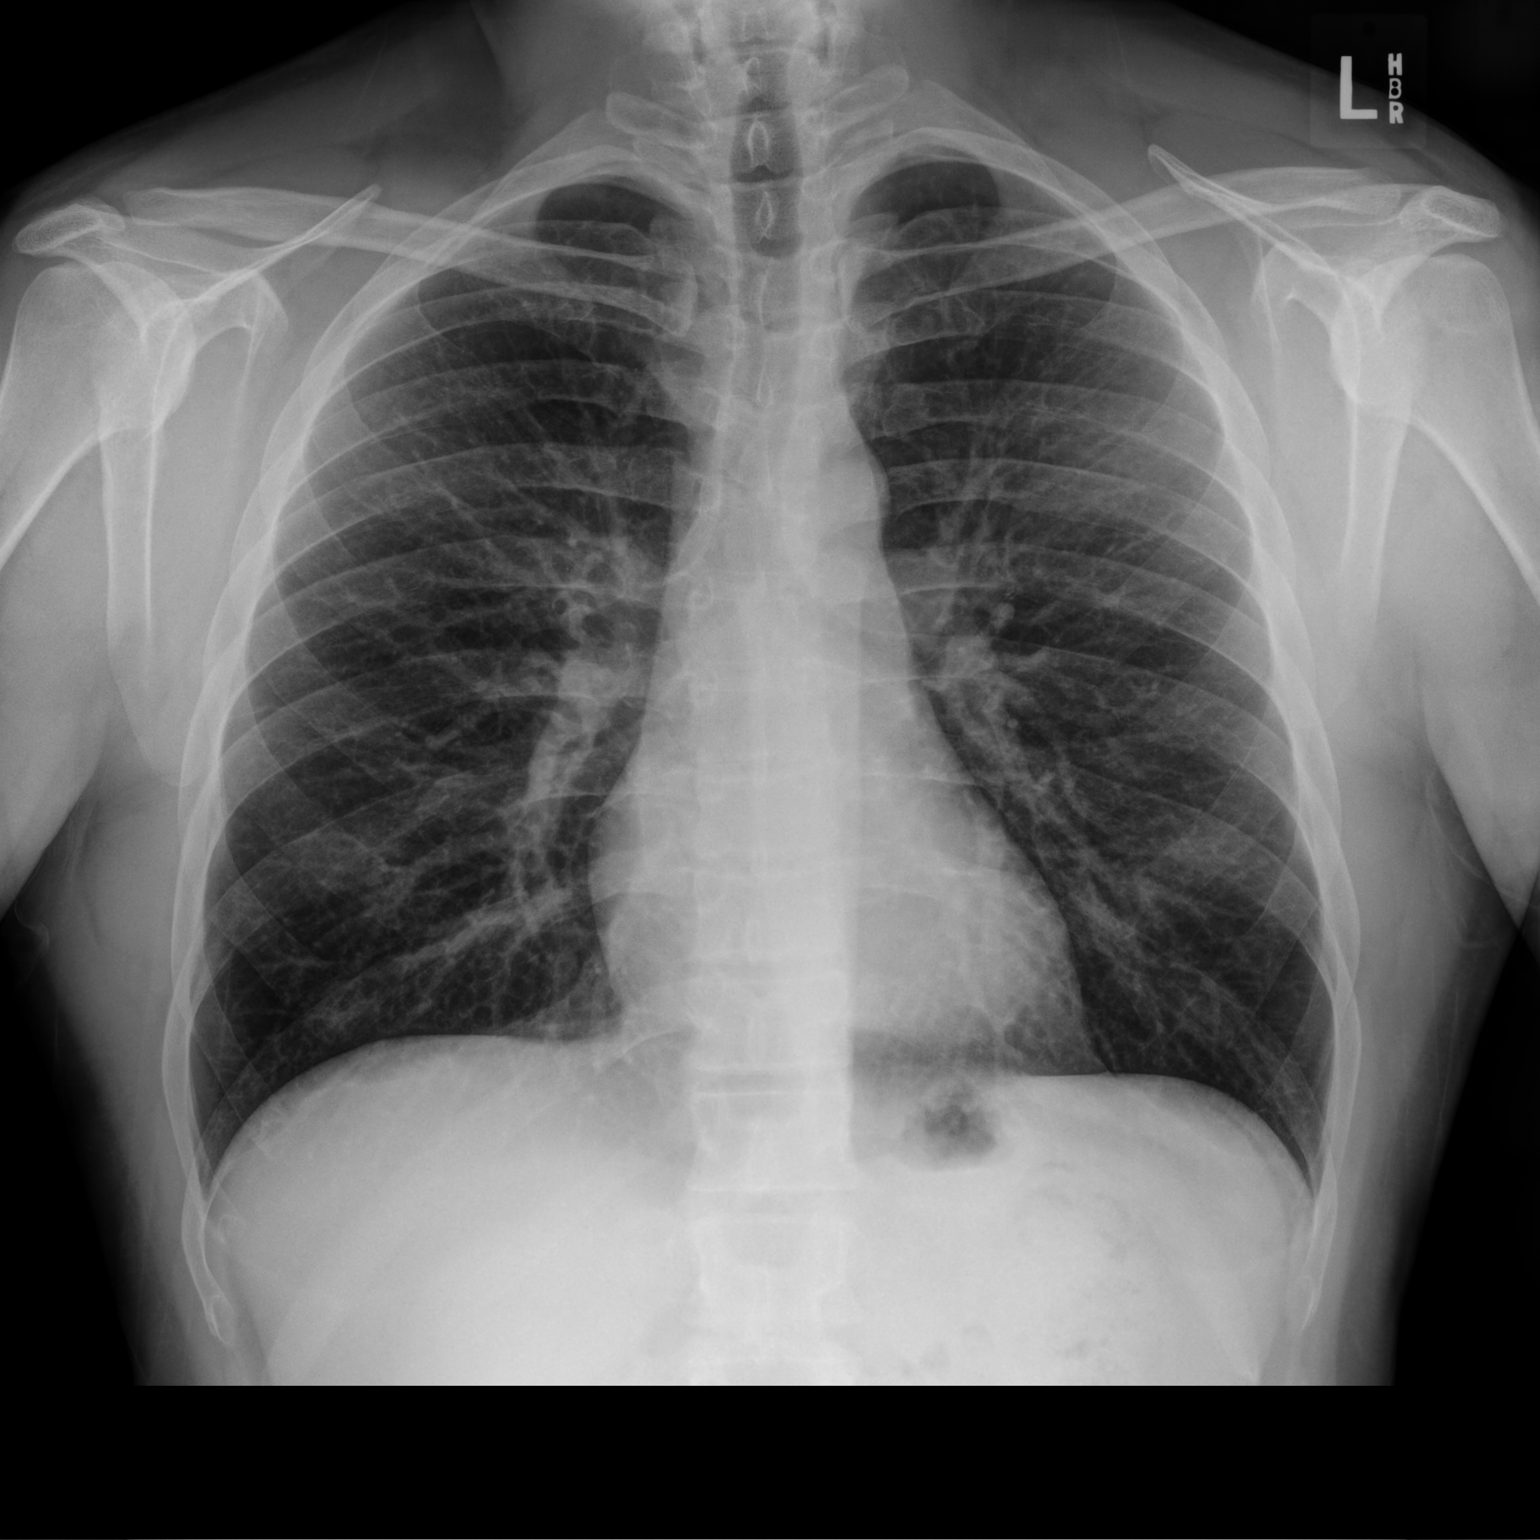

[chest lat]
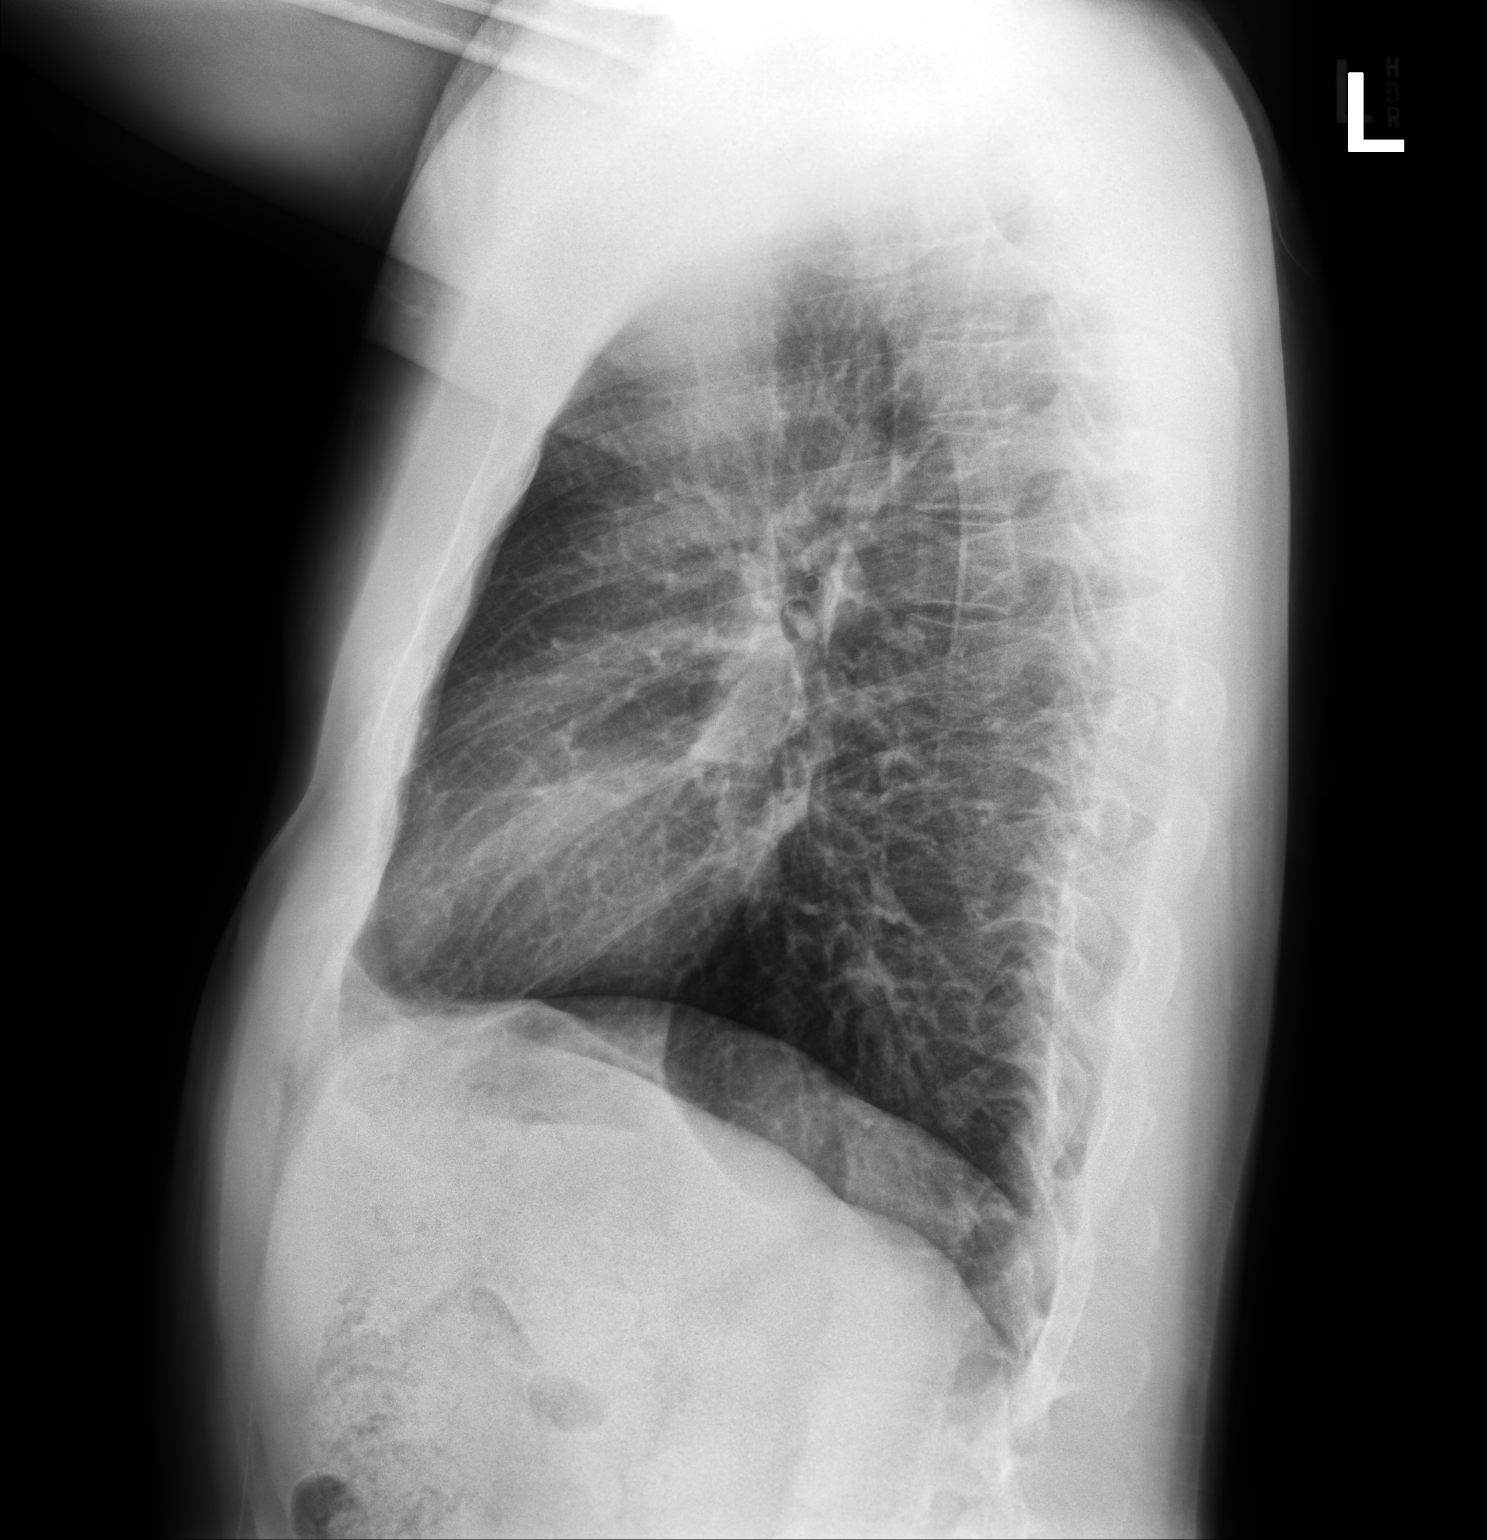

[2 of 2 positions shown; findings below may reference images not displayed]

FINDINGS: The lungs are clear. The heart size and pulmonary vascularity are
normal. No adenopathy. No bone lesions.
IMPRESSION: No abnormality noted.

## 2021-08-12 NOTE — Telephone Encounter (Signed)
Error

## 2022-02-19 ENCOUNTER — Ambulatory Visit
Admission: RE | Admit: 2022-02-19 | Discharge: 2022-02-19 | Disposition: A | Discharge status: Home / Self Care | Payer: BLUE CROSS/BLUE SHIELD | Source: Ambulatory Visit

## 2022-02-19 ENCOUNTER — Other Ambulatory Visit: Payer: Self-pay

## 2022-02-19 ENCOUNTER — Ambulatory Visit: Payer: BLUE CROSS/BLUE SHIELD | Attending: Family | Admitting: Family

## 2022-02-19 VITALS — BP 143/86 | HR 72 | Temp 98.1°F | Resp 17

## 2022-02-19 DIAGNOSIS — M79672 Pain in left foot: Secondary | ICD-10-CM

## 2022-02-19 DIAGNOSIS — M958 Other specified acquired deformities of musculoskeletal system: Secondary | ICD-10-CM

## 2022-02-19 DIAGNOSIS — M9262 Juvenile osteochondrosis of tarsus, left ankle: Secondary | ICD-10-CM | POA: Insufficient documentation

## 2022-02-19 NOTE — Patient Instructions (Signed)
Handout given

## 2022-02-19 NOTE — UC Provider Note (Signed)
History     Chief Complaint   Patient presents with    Ankle Pain    Foot Pain     Patient presents to UC with left ankle and foot pain. Symptoms started on Monday. Patient denies injury. Usually hikes and walks and never had any issues;   If he is in barefeet he will have pain in the past;             History provided by:  Patient  Language interpreter used: No    Ankle Pain  Location:  Ankle and foot  Time since incident: went to [place] and was walking and walked for 7-8 miles - was wet and slippery, but no injury;  Ankle location:  L ankle  Foot location:  L foot  Pain details:     Radiates to: up to ankle.    Pain severity now: right now 2-3/10; worst 8-9/10.    Duration: Monday.    Timing:  Constant    Progression:  Worsening  Chronicity:  New  Relieved by: wrapped, ice, ibuproffen;  Associated symptoms: no decreased ROM, no numbness, no swelling and no tingling    Foot Pain        Medical/Surgical/Family History     No past medical history on file.     There is no problem list on file for this patient.           No past surgical history on file.  No family history on file.          Living Situation       Questions Responses    Patient lives with     Homeless     Caregiver for other family member     External Services     Employment     Domestic Violence Risk                   Review of Systems   Review of Systems    Physical Exam   Vitals     First Recorded BP: 143/86, Resp: 17, Temp: 36.7 C (98.1 F) Oxygen Therapy SpO2: 98 %, Heart Rate: 72, (02/19/22 1234)  .      Physical Exam  Vitals and nursing note reviewed.   Constitutional:       General: He is awake. He is not in acute distress.     Appearance: Normal appearance. He is well-developed and well-groomed. He is not ill-appearing.   HENT:      Head: Normocephalic.      Mouth/Throat:      Lips: Pink.   Eyes:      General: No scleral icterus.     Conjunctiva/sclera: Conjunctivae normal.   Pulmonary:      Effort: Pulmonary effort is normal. No  respiratory distress.   Musculoskeletal:      Left ankle: Swelling (minimal) present. Tenderness (strength 5/5) present. Decreased range of motion (minimally). Anterior drawer test negative. Normal pulse.      Left Achilles Tendon: Tenderness present.      Left foot: Normal range of motion and normal capillary refill. Tenderness (bottom of foot at the heel) present. No swelling or crepitus. Normal pulse.   Skin:     General: Skin is warm and dry.   Neurological:      Mental Status: He is alert.   Psychiatric:         Mood and Affect: Mood normal.  Behavior: Behavior is cooperative.          Medical Decision Making   Medical Decision Making    Plan and Results:      Encounter orders    Orders Placed This Encounter      * Foot LEFT standard AP, Lateral, Obliqu        XRay results  * Foot LEFT standard AP, Lateral, Obliqu    Result Date: 02/19/2022  02/19/2022 1:32 PM LEFT FOOT X-RAYS CLINICAL INFORMATION:  left foot pain - no injury, but sounds like heel spur, M79.672-Pain in left foot. COMPARISON:  None. PROCEDURE: Three projections of the left foot were obtained. FINDINGS/    Haglund deformity of the calcaneus without apparent retrocalcaneal bursitis. No calcaneal enthesophytes. END OF IMPRESSION       [facility] submits this DICOM format image data and final report to the [facility], an independent secure electronic health information exchange, on a reciprocally searchable basis (with patient authorization) for a minimum of 12 months after exam date.        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 Left foot pain  M79.672 729.5     Hand out given for Haglund deformity from [facility] clinic website.   Encouraged ice, elevation, ibuprofen, tylenol as needed.    Follow up with ortho or PCP if symptoms persist or worsen    Lynnell Dike, NP          Author:  Lynnell Dike, NP

## 2022-02-19 NOTE — Nursing Note (Signed)
Patient presents to UC with left ankle and foot pain. Symptoms started on Monday. Patient denies injury.

## 2022-12-17 ENCOUNTER — Telehealth: Payer: Self-pay | Admitting: Emergency Medicine

## 2022-12-17 NOTE — Telephone Encounter (Signed)
Our office has received a referral from [facility] for patient to re-establish care.  Before patient schedules, he is wanting to know if we can do a blood test to check for mold exposure. Please advise

## 2022-12-17 NOTE — Telephone Encounter (Signed)
Called and left detailed vm , advising pt that he will need to be seen first before having any labs done, left this message on pt vm  to call the office to schedule an appt .

## 8386-11-30 DEATH — deceased
